# Patient Record
Sex: Female | Born: 1980 | Race: Black or African American | Hispanic: No | Marital: Single | Smoking: Never smoker
Health system: Southern US, Community
[De-identification: ages and names within clinical notes are randomized; demographics above are authoritative.]

## PROBLEM LIST (undated history)

## (undated) DIAGNOSIS — I10 Essential (primary) hypertension: Secondary | ICD-10-CM

---

## 2012-04-13 DIAGNOSIS — B370 Candidal stomatitis: Secondary | ICD-10-CM | POA: Insufficient documentation

## 2016-08-12 ENCOUNTER — Other Ambulatory Visit: Payer: Self-pay | Admitting: Family Medicine

## 2016-08-12 DIAGNOSIS — R102 Pelvic and perineal pain: Secondary | ICD-10-CM

## 2016-08-12 DIAGNOSIS — R1030 Lower abdominal pain, unspecified: Secondary | ICD-10-CM

## 2016-08-16 ENCOUNTER — Ambulatory Visit
Admission: RE | Admit: 2016-08-16 | Discharge: 2016-08-16 | Disposition: A | Discharge status: Home / Self Care | Payer: Medicaid Other | Source: Ambulatory Visit | Attending: Family Medicine | Admitting: Family Medicine

## 2016-08-16 DIAGNOSIS — R1030 Lower abdominal pain, unspecified: Secondary | ICD-10-CM | POA: Diagnosis present

## 2017-05-09 IMAGING — US US TRANSVAGINAL NON-OB
1 series · 14 of 25 positions shown · non-contrast
Comparison: None

CLINICAL DATA: Pelvic pain x1 month

EXAM:
TRANSABDOMINAL AND TRANSVAGINAL ULTRASOUND OF PELVIS
TECHNIQUE: Both transabdominal and transvaginal ultrasound examinations of the
pelvis were performed. Transabdominal technique was performed for
global imaging of the pelvis including uterus, ovaries, adnexal
regions, and pelvic cul-de-sac. It was necessary to proceed with
endovaginal exam following the transabdominal exam to visualize the
bilateral ovaries.

[Series 1: us transvaginal non-ob · 0.17mm/px · 14 of 109 slices shown]
[im 1/109]
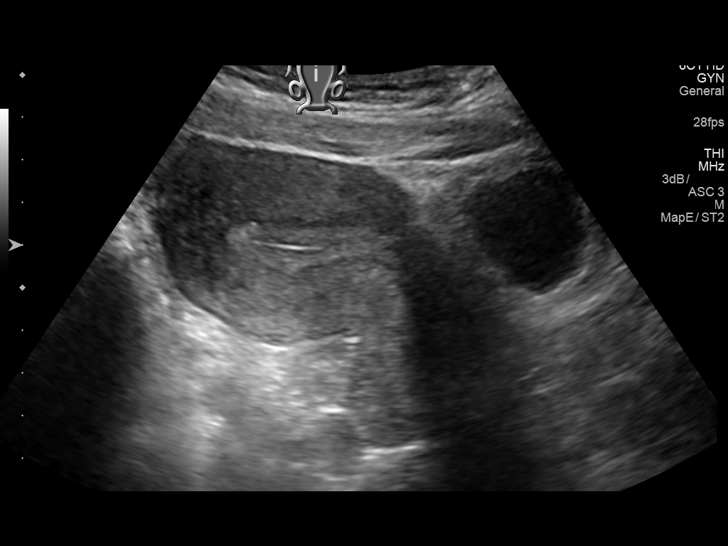
[im 10/109]
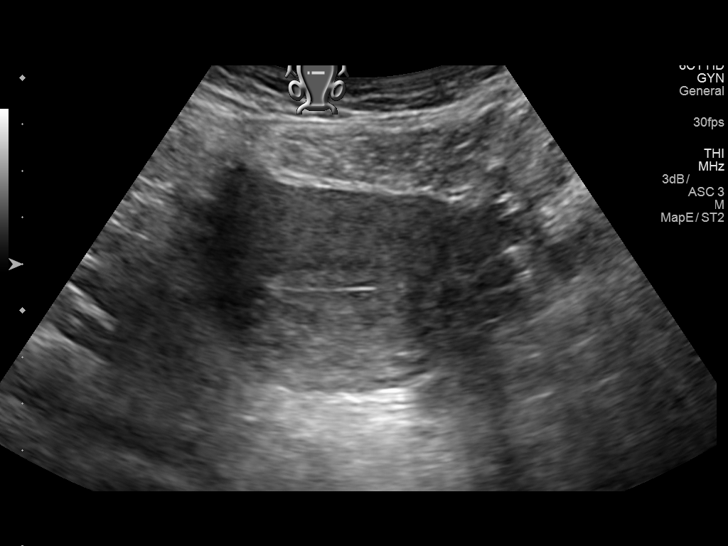
[im 19/109]
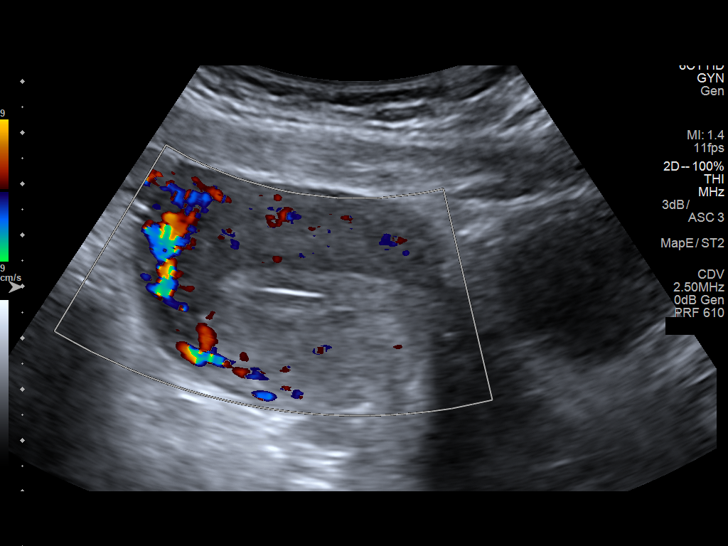
[im 28/109]
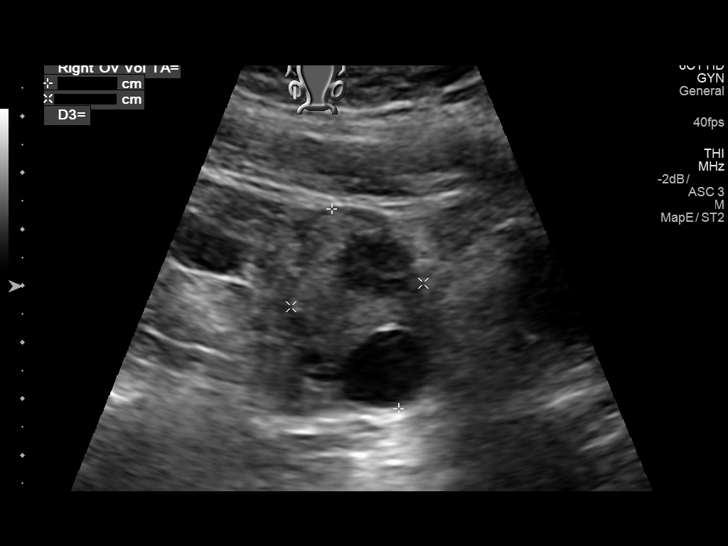
[im 37/109]
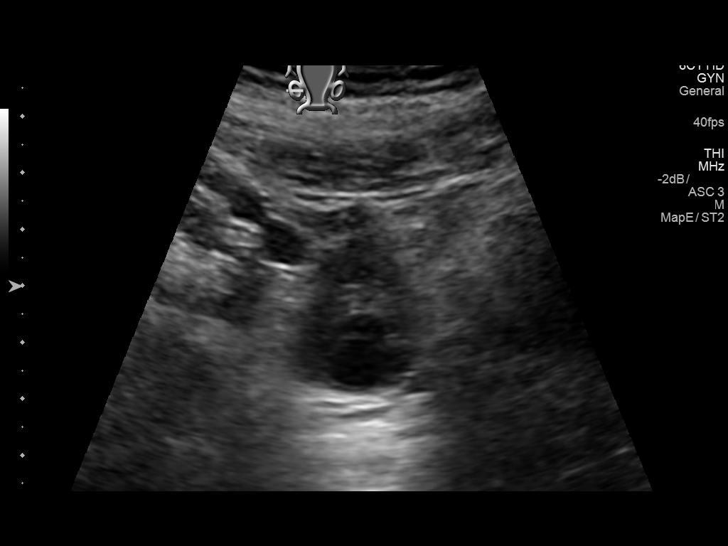
[im 41/109]
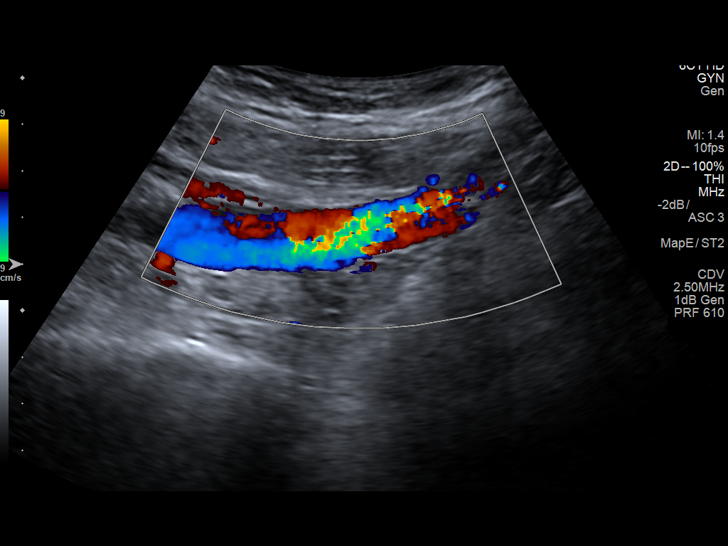
[im 50/109]
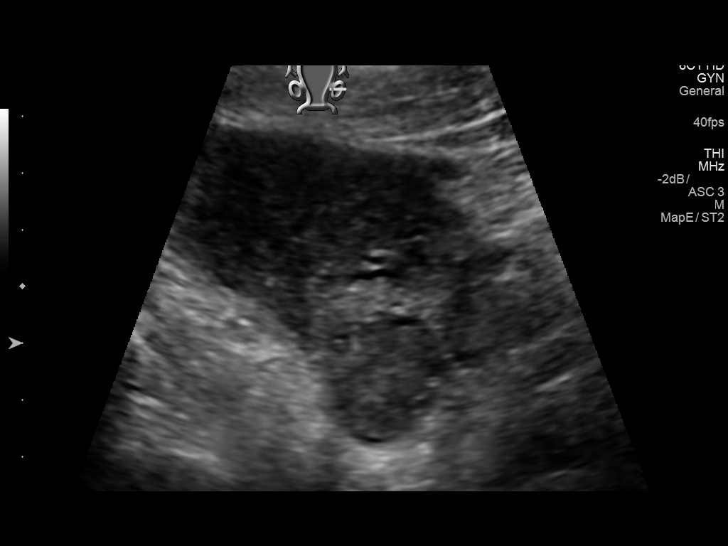
[im 59/109]
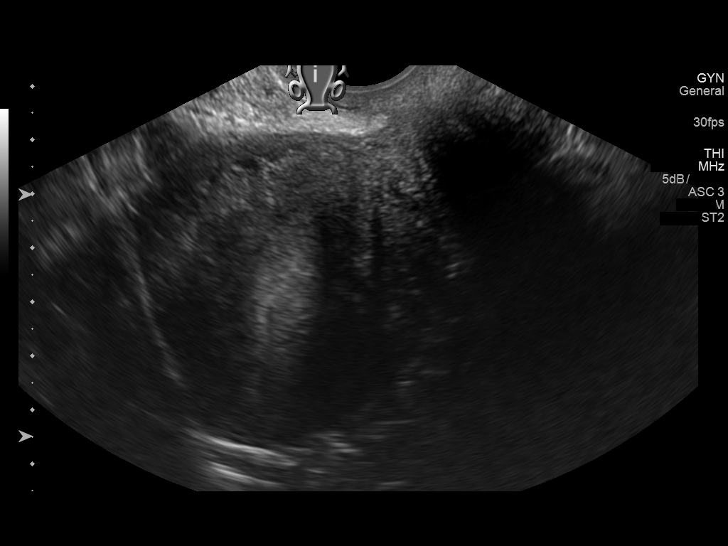
[im 68/109]
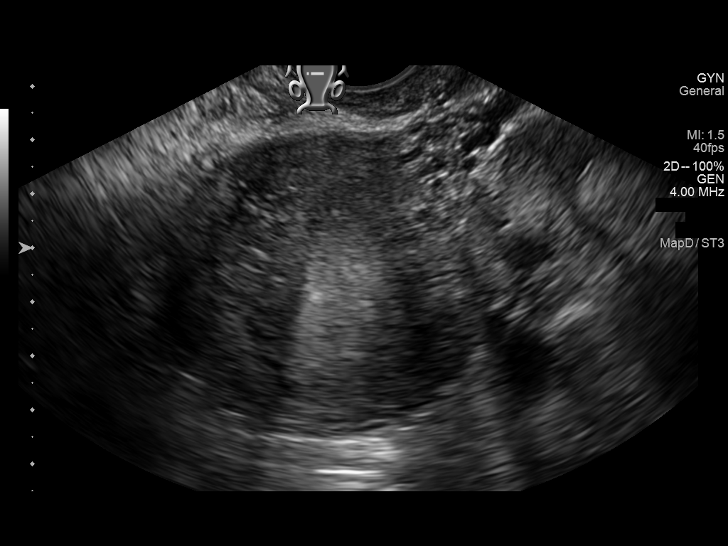
[im 73/109]
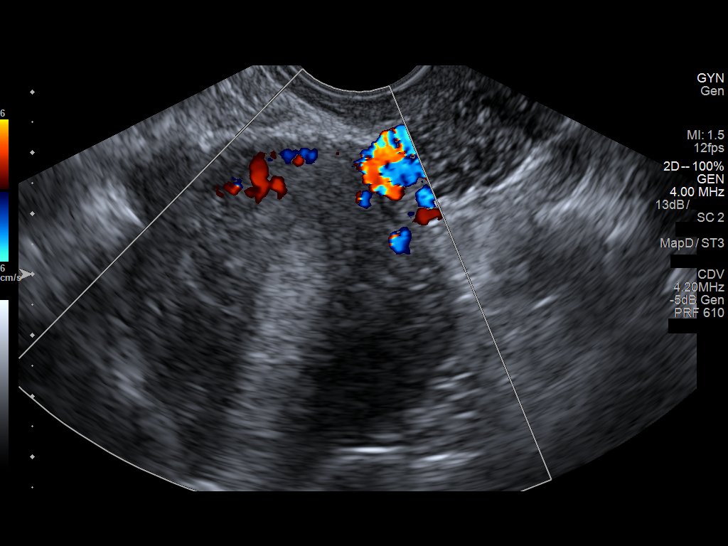
[im 82/109]
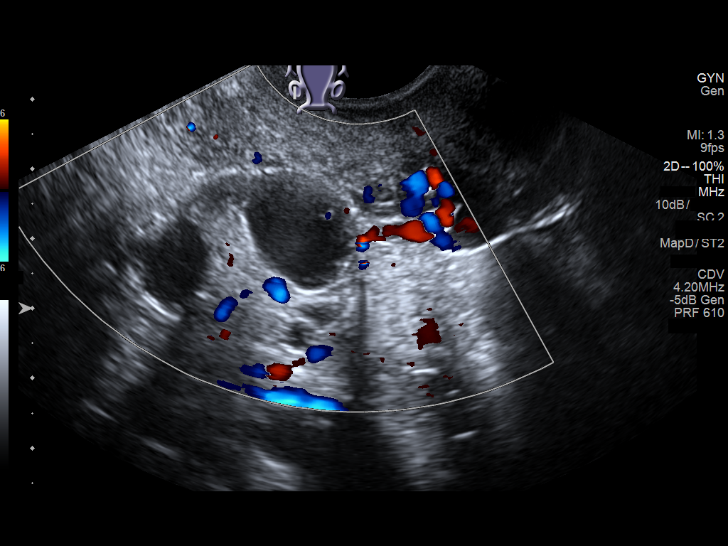
[im 91/109]
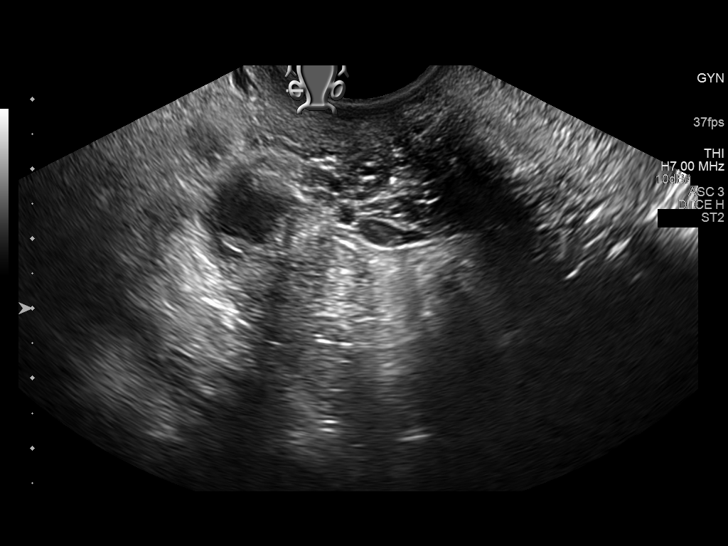
[im 100/109]
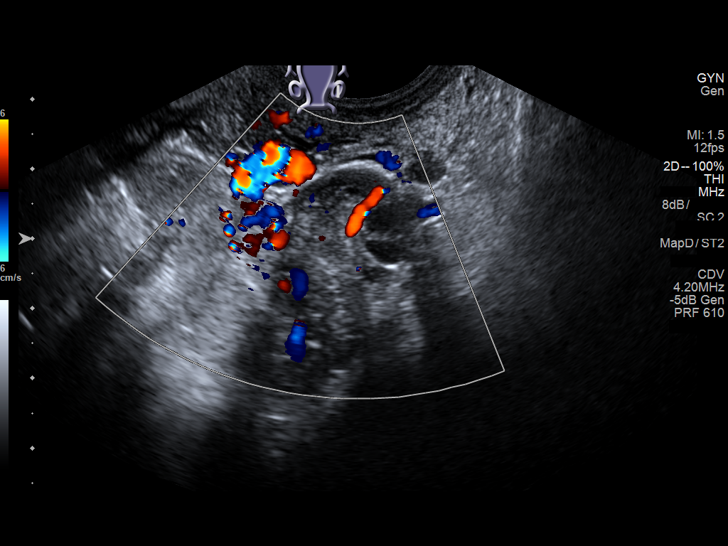
[im 109/109]
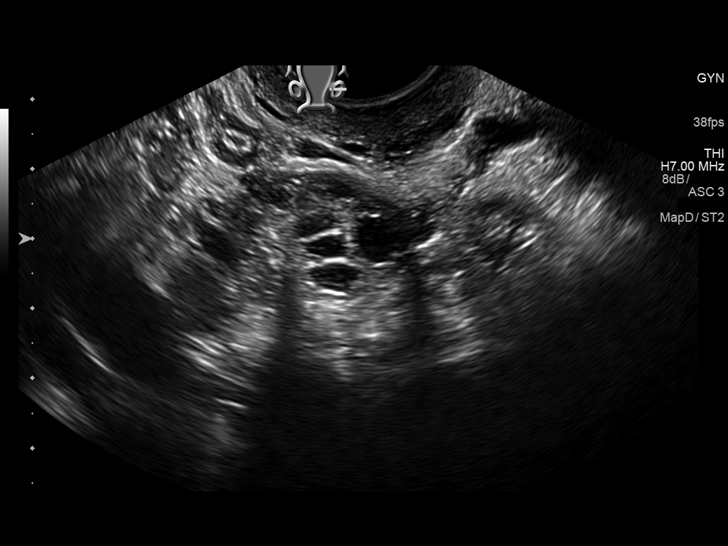

[14 of 25 positions shown; findings below may reference images not displayed]

FINDINGS: Uterus

Measurements: 9.9 x 4.6 x 5.6 cm. Dystrophic calcifications in the
lower uterine segment, likely related to prior C-section.

Endometrium

Thickness: 13 mm.  No focal abnormality visualized.

Right ovary

Measurements: 3.7 x 2.5 x 2.6 cm. Normal appearance/no adnexal mass.

Left ovary

Measurements: 3.4 x 2.0 x 3.0 cm. Normal appearance/no adnexal mass.

Other findings

No abnormal free fluid.
IMPRESSION: Negative pelvic ultrasound.

## 2017-10-06 ENCOUNTER — Other Ambulatory Visit: Payer: Self-pay | Admitting: Primary Care

## 2017-10-06 DIAGNOSIS — G8929 Other chronic pain: Secondary | ICD-10-CM

## 2017-10-06 DIAGNOSIS — R102 Pelvic and perineal pain: Principal | ICD-10-CM

## 2017-10-12 ENCOUNTER — Ambulatory Visit: Payer: Medicaid Other

## 2017-10-17 ENCOUNTER — Ambulatory Visit: Payer: Medicaid Other

## 2017-10-26 ENCOUNTER — Ambulatory Visit: Payer: Medicaid Other

## 2017-11-03 ENCOUNTER — Ambulatory Visit
Admission: RE | Admit: 2017-11-03 | Discharge: 2017-11-03 | Disposition: A | Discharge status: Home / Self Care | Payer: Medicaid Other | Source: Ambulatory Visit | Attending: Primary Care | Admitting: Primary Care

## 2017-11-03 DIAGNOSIS — G8929 Other chronic pain: Secondary | ICD-10-CM

## 2017-11-03 DIAGNOSIS — R102 Pelvic and perineal pain: Secondary | ICD-10-CM | POA: Diagnosis present

## 2018-07-27 IMAGING — US US PELVIS COMPLETE TRANSABD/TRANSVAG
1 series · 14 of 25 positions shown · non-contrast
Comparison: 08/16/2016

CLINICAL DATA: Chronic pelvic pain

EXAM:
TRANSABDOMINAL AND TRANSVAGINAL ULTRASOUND OF PELVIS
TECHNIQUE: Both transabdominal and transvaginal ultrasound examinations of the
pelvis were performed. Transabdominal technique was performed for
global imaging of the pelvis including uterus, ovaries, adnexal
regions, and pelvic cul-de-sac. It was necessary to proceed with
endovaginal exam following the transabdominal exam to visualize the
endometrium and ovaries.

[Series 1: us pelvis complete transabd/transvag · 0.20mm/px · 14 of 141 slices shown]
[im 1/141]
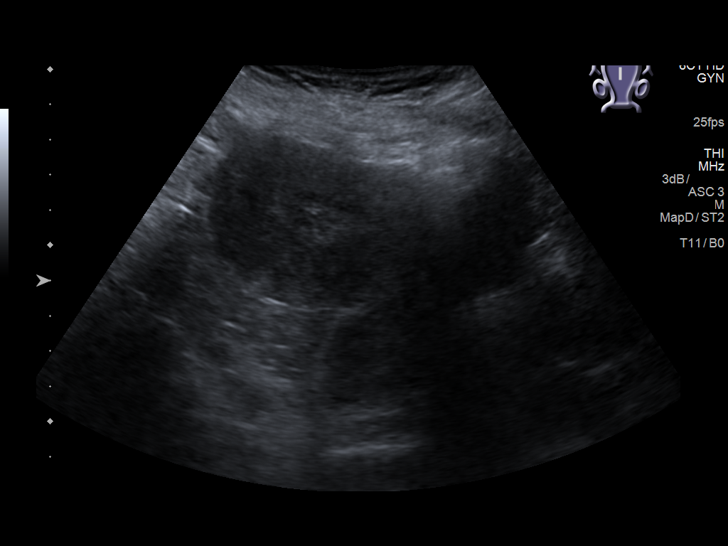
[im 12/141]
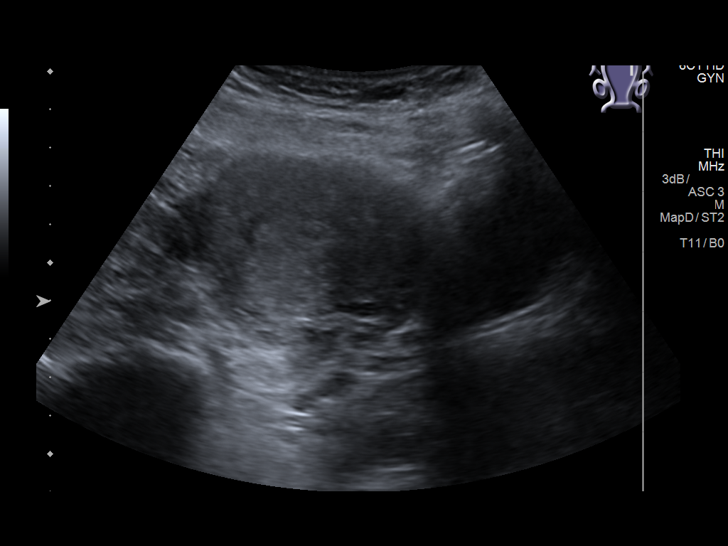
[im 24/141]
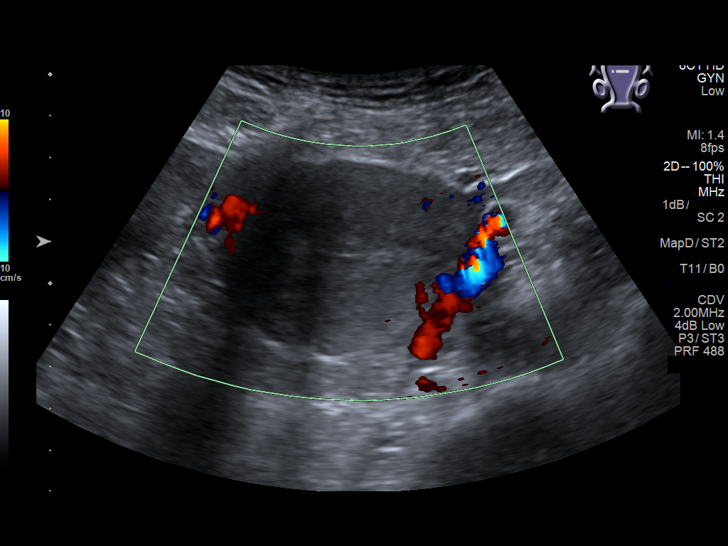
[im 36/141]
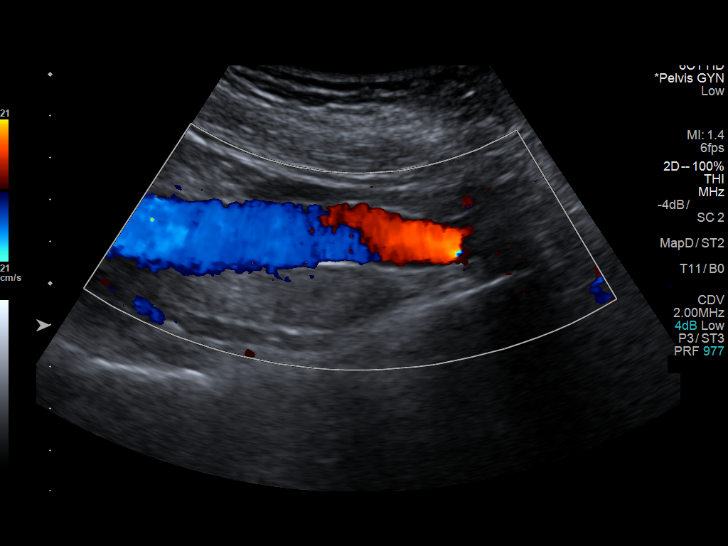
[im 47/141]
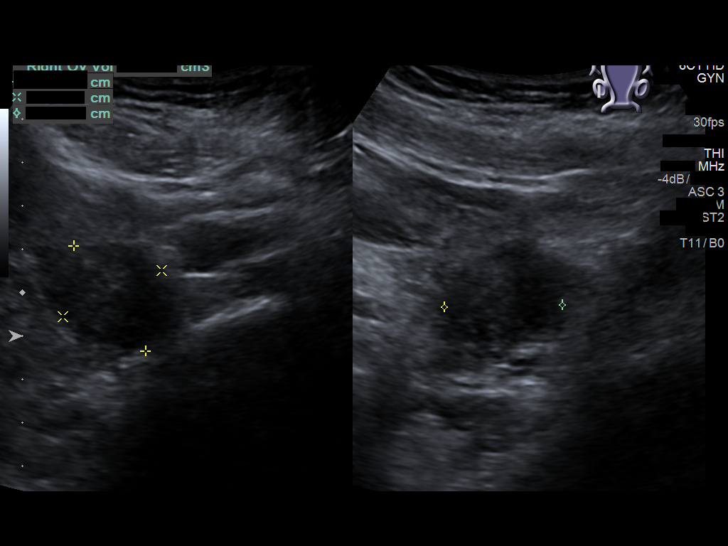
[im 53/141]
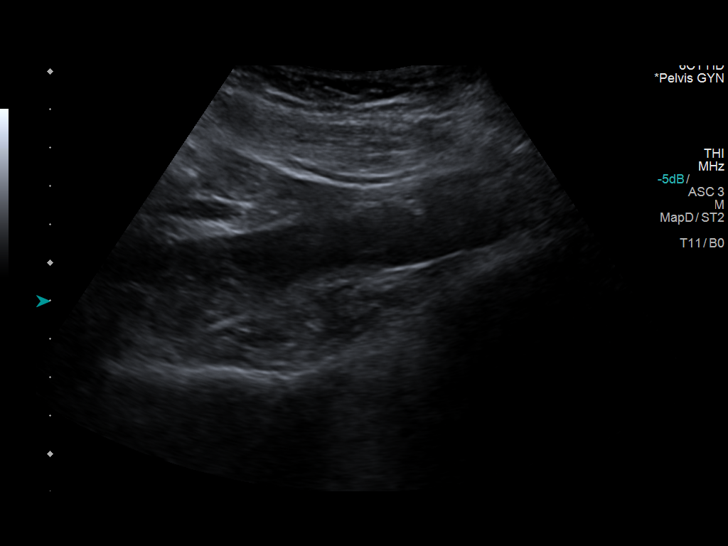
[im 65/141]
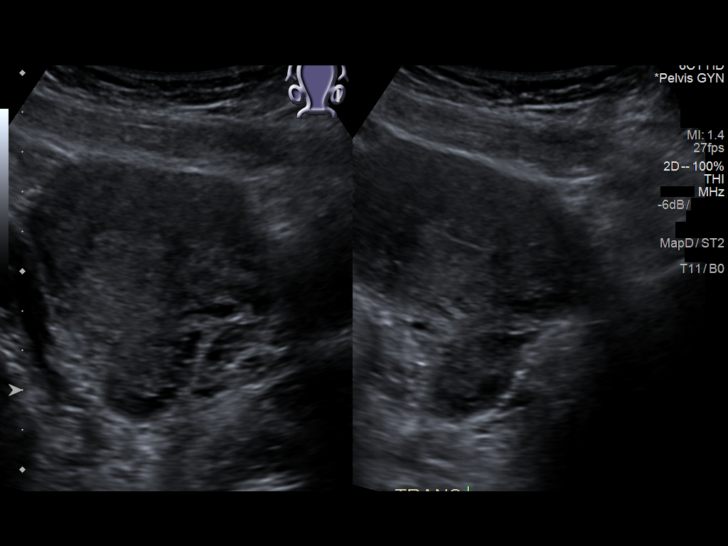
[im 76/141]
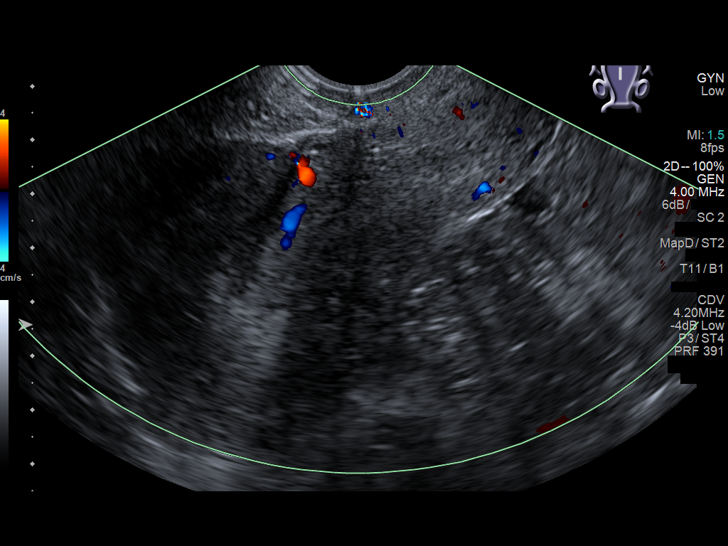
[im 88/141]
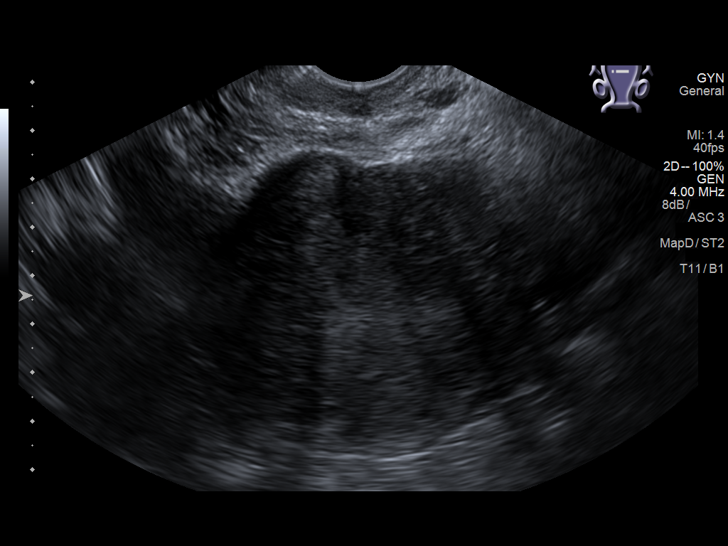
[im 94/141]
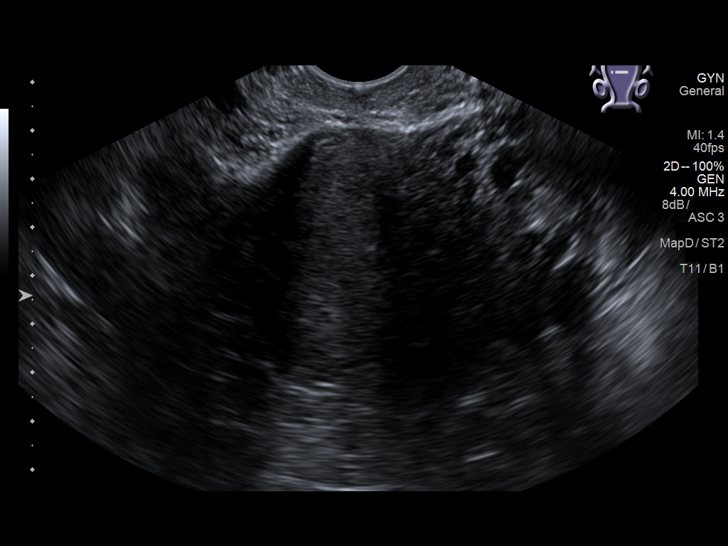
[im 106/141]
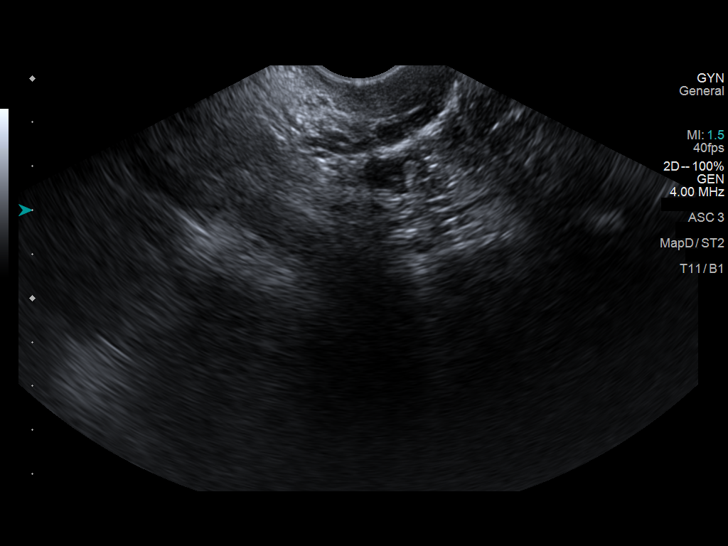
[im 117/141]
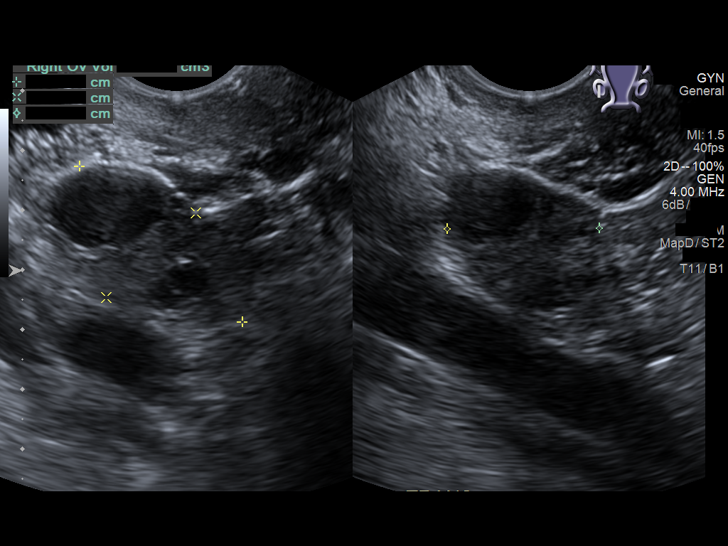
[im 129/141]
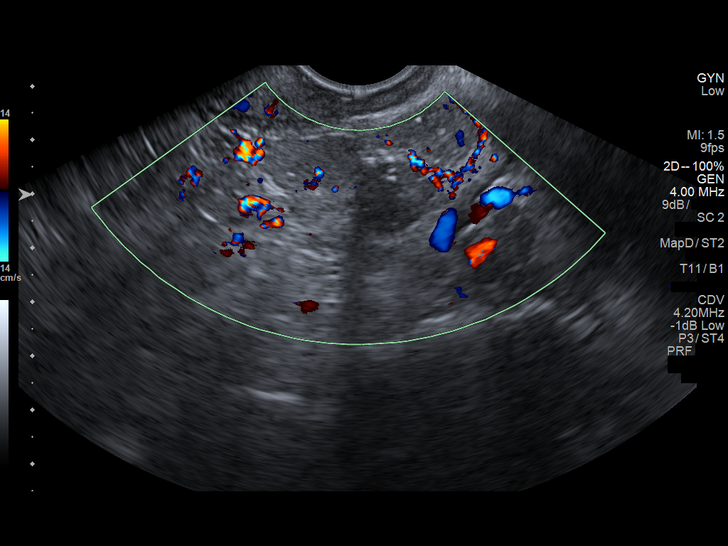
[im 141/141]
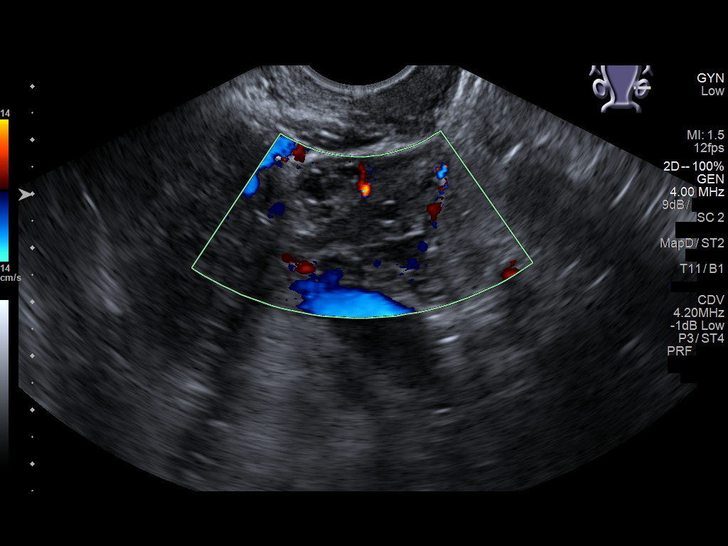

[14 of 25 positions shown; findings below may reference images not displayed]

FINDINGS: Uterus

Measurements: 9.6 x 4.9 x 7.1 cm. No fibroids or other mass
visualized.

Endometrium

Thickness: 13 mm.  No focal abnormality visualized.

Right ovary

Measurements: 2.9 x 2.5 x 2.7 cm. Normal appearance/no adnexal mass.

Left ovary

Measurements: 2.5 x 2.1 x 1.9 cm. Normal appearance/no adnexal mass.

Other findings

No abnormal free fluid.
IMPRESSION: Negative pelvic ultrasound

## 2020-05-23 ENCOUNTER — Encounter: Payer: Self-pay | Admitting: Emergency Medicine

## 2020-05-23 ENCOUNTER — Emergency Department: Payer: Medicaid Other

## 2020-05-23 ENCOUNTER — Emergency Department
Admission: EM | Admit: 2020-05-23 | Discharge: 2020-05-23 | Disposition: A | Discharge status: Home / Self Care | Payer: Medicaid Other | Attending: Emergency Medicine | Admitting: Emergency Medicine

## 2020-05-23 ENCOUNTER — Other Ambulatory Visit: Payer: Self-pay

## 2020-05-23 DIAGNOSIS — Y999 Unspecified external cause status: Secondary | ICD-10-CM | POA: Insufficient documentation

## 2020-05-23 DIAGNOSIS — Y92002 Bathroom of unspecified non-institutional (private) residence single-family (private) house as the place of occurrence of the external cause: Secondary | ICD-10-CM | POA: Insufficient documentation

## 2020-05-23 DIAGNOSIS — Y9389 Activity, other specified: Secondary | ICD-10-CM | POA: Diagnosis not present

## 2020-05-23 DIAGNOSIS — X509XXA Other and unspecified overexertion or strenuous movements or postures, initial encounter: Secondary | ICD-10-CM | POA: Diagnosis not present

## 2020-05-23 DIAGNOSIS — S39012A Strain of muscle, fascia and tendon of lower back, initial encounter: Secondary | ICD-10-CM | POA: Diagnosis not present

## 2020-05-23 DIAGNOSIS — S3992XA Unspecified injury of lower back, initial encounter: Secondary | ICD-10-CM | POA: Diagnosis present

## 2020-05-23 LAB — POCT PREGNANCY, URINE: Preg Test, Ur: NEGATIVE

## 2020-05-23 MED ORDER — CYCLOBENZAPRINE HCL 5 MG PO TABS
5.0000 mg | ORAL_TABLET | Freq: Three times a day (TID) | ORAL | 0 refills | Status: AC | PRN
Start: 2020-05-23 — End: ?

## 2020-05-23 MED ORDER — KETOROLAC TROMETHAMINE 10 MG PO TABS: 10.0000 mg | ORAL_TABLET | Freq: Three times a day (TID) | ORAL | 0 refills | Status: DC

## 2020-05-23 MED ORDER — ORPHENADRINE CITRATE 30 MG/ML IJ SOLN
60.0000 mg | INTRAMUSCULAR | Status: AC
  Administered 2020-05-23: 60 mg via INTRAMUSCULAR
  Filled 2020-05-23: qty 2

## 2020-05-23 MED ORDER — KETOROLAC TROMETHAMINE 30 MG/ML IJ SOLN
30.0000 mg | Freq: Once | INTRAMUSCULAR | Status: AC
  Administered 2020-05-23: 30 mg via INTRAMUSCULAR
  Filled 2020-05-23: qty 1

## 2020-05-23 MED ORDER — LIDOCAINE 5 % EX PTCH
1.0000 | MEDICATED_PATCH | CUTANEOUS | 0 refills | Status: AC
Start: 2020-05-23 — End: 2020-05-28

## 2020-05-23 NOTE — ED Provider Notes (Signed)
[facility] Emergency Department Provider Note ____________________________________________  Time seen: 1508  I have reviewed the triage vital signs and the nursing notes.  HISTORY  Chief Complaint  Back Pain and Leg Pain  HPI Brittany Holland is a 39 y.o. female presents to the ED for evaluation of right lower back pain with referral into the RLE. She denies fall or trauma, but reports onset after she reached up while in the shower yesterday. She describes sharp LBP without incontinence, saddle anesthesias, or foot drop.     History reviewed. No pertinent past medical history.  There are no problems to display for this patient.   Past Surgical History:  Procedure Laterality Date  . CESAREAN SECTION     x 2    Prior to Admission medications   Medication Sig Start Date End Date Taking? Authorizing Provider  cyclobenzaprine (FLEXERIL) 5 MG tablet Take 1 tablet (5 mg total) by mouth 3 (three) times daily as needed. 05/23/20   Ayani Ospina, Dannielle Karvonen, PA-C  ketorolac (TORADOL) 10 MG tablet Take 1 tablet (10 mg total) by mouth every 8 (eight) hours. 05/23/20   Sierrah Luevano, Dannielle Karvonen, PA-C  lidocaine (LIDODERM) 5 % Place 1 patch onto the skin daily for 5 doses. Remove & Discard patch after 12 hours of wear each day. 05/23/20 05/28/20  Alianis Trimmer, Dannielle Karvonen, PA-C    Allergies Patient has no known allergies.  History reviewed. No pertinent family history.  Social History Social History   Tobacco Use  . Smoking status: Never Smoker  . Smokeless tobacco: Never Used  Substance Use Topics  . Alcohol use: Not Currently  . Drug use: Not Currently    Review of Systems  Constitutional: Negative for fever. Cardiovascular: Negative for chest pain. Respiratory: Negative for shortness of breath. Gastrointestinal: Negative for abdominal pain, vomiting and diarrhea. Genitourinary: Negative for dysuria. Musculoskeletal: Positive for back pain. Skin: Negative for  rash. Neurological: Negative for headaches, focal weakness or numbness. ____________________________________________  PHYSICAL EXAM:  VITAL SIGNS: ED Triage Vitals  Enc Vitals Group     BP 05/23/20 1349 (!) 176/102     Pulse Rate 05/23/20 1349 86     Resp 05/23/20 1349 19     Temp 05/23/20 1349 98.7 F (37.1 C)     Temp Source 05/23/20 1349 Oral     SpO2 05/23/20 1349 100 %     Weight 05/23/20 1349 230 lb (104.3 kg)     Height 05/23/20 1349 5\' 7"  (1.702 m)     Head Circumference --      Peak Flow --      Pain Score 05/23/20 1401 7     Pain Loc --      Pain Edu? --      Excl. in [place]? --     Constitutional: Alert and oriented. Well appearing and in no distress. Head: Normocephalic and atraumatic. Eyes: Conjunctivae are normal. Normal extraocular movements Neck: Supple. No thyromegaly. Cardiovascular: Normal rate, regular rhythm. Normal distal pulses. Respiratory: Normal respiratory effort. No wheezes/rales/rhonchi. Gastrointestinal: Soft and nontender. No distention. Musculoskeletal: Spinal alignment without midline tenderness, spasm, deformity, or step-off.  Patient has instructed exam without assistance.  nontender with normal range of motion in all extremities.  Neurologic: Cranial nerves II through XII grossly intact.  Normal LE DTRs bilaterally.  Normal gait without ataxia. Normal speech and language. No gross focal neurologic deficits are appreciated. Skin:  Skin is warm, dry and intact. No rash noted. Psychiatric: Mood and  affect are normal. Patient exhibits appropriate insight and judgment. ____________________________________________   LABS (pertinent positives/negatives) Labs Reviewed  POCT PREGNANCY, URINE  ____________________________________________   RADIOLOGY  DG Lumbar Spine IMPRESSION: Normal lumbar spine. ____________________________________________  PROCEDURES  Toradol 30 mg IM Norflex 60 mg  IM  Procedures ____________________________________________  INITIAL IMPRESSION / ASSESSMENT AND PLAN / ED COURSE  Patient with ED evaluation of acute lumbosacral muscle strain after reaching in the shower.  No fall or trauma reported.  Exam is overall benign reassuring at this time.  No acute or muscle deficit is appreciated x-ray is negative and reassuring as shows no acute spinal process changes.  Patient reports improvement of her symptoms after ED medication administration.  She be discharged with prescription for Toradol, Flexeril, and Lidoderm patches.  Follow-up with primary provider return to the ED as needed.  Brittany Holland was evaluated in Emergency Department on 05/23/2020 for the symptoms described in the history of present illness. She was evaluated in the context of the global COVID-19 pandemic, which necessitated consideration that the patient might be at risk for infection with the SARS-CoV-2 virus that causes COVID-19. Institutional protocols and algorithms that pertain to the evaluation of patients at risk for COVID-19 are in a state of rapid change based on information released by regulatory bodies including the CDC and federal and state organizations. These policies and algorithms were followed during the patient's care in the ED. ____________________________________________  FINAL CLINICAL IMPRESSION(S) / ED DIAGNOSES  Final diagnoses:  Strain of lumbar region, initial encounter      Lissa Hoard, PA-C 05/23/20 2247    Sharyn Creamer, MD 05/25/20 0006

## 2020-05-23 NOTE — ED Notes (Signed)
See triage note  Presents with lower back pain  States pain is moving into right leg   States pain started yesterday  Became worse this am

## 2020-05-23 NOTE — ED Triage Notes (Signed)
Pt to ED via POV c/o back pain and right leg pain. Pt states that the pain started yesterday. Pt denies recent injury. Pt states that she reached up yesterday while in the shower and got a sharp pain in her back, the pain got worse as the day went on. Pt states that when she woke up this morning that pain was in her back and leg. Pt states that she has taken Aleve last night and it did not help with the pain. Pt is in NAD.

## 2020-05-23 NOTE — Discharge Instructions (Signed)
Your exam and XR are negative for any acute finding. You are experiencing muscle pain and spasms from your lumbosacral strain. Take the prescription meds as directed. Apply ice and/or moist heat to any sore muscles. Follow-up with your provider for continued symptoms. Return to the ED as needed.

## 2020-07-16 ENCOUNTER — Ambulatory Visit: Payer: Medicaid Other

## 2020-07-22 ENCOUNTER — Ambulatory Visit: Payer: Medicaid Other

## 2020-07-24 ENCOUNTER — Ambulatory Visit: Payer: Medicaid Other | Attending: Family Medicine

## 2020-07-24 ENCOUNTER — Other Ambulatory Visit: Payer: Self-pay

## 2020-07-24 DIAGNOSIS — M545 Low back pain, unspecified: Secondary | ICD-10-CM

## 2020-07-24 DIAGNOSIS — M5416 Radiculopathy, lumbar region: Secondary | ICD-10-CM | POA: Diagnosis present

## 2020-07-24 DIAGNOSIS — M6281 Muscle weakness (generalized): Secondary | ICD-10-CM | POA: Insufficient documentation

## 2020-07-24 DIAGNOSIS — R262 Difficulty in walking, not elsewhere classified: Secondary | ICD-10-CM | POA: Diagnosis present

## 2020-07-24 NOTE — Therapy (Signed)
[facility] PHYSICAL AND SPORTS MEDICINE [address] [place], [place], [id number] Phone: [phone]   Fax:  [phone]  Physical Therapy Evaluation  Patient Details  Name: Brittany RusselMaronda Flight MRN: [id number] Date of Birth: November 19, 1981 Referring Provider (PT): Toy CookeyEmily Headrick, FNP   Encounter Date: 07/24/2020   PT End of Session - 07/24/20 0854    Visit Number 1    Number of Visits 4    Date for PT Re-Evaluation 08/21/20    Authorization Type 1    Authorization Time Period of 4 Medicaid    PT Start Time [phone]    PT Stop Time 0946    PT Time Calculation (min) 52 min    Activity Tolerance Patient tolerated treatment well    [facility] for tasks assessed/performed           No past medical history on file.  Past Surgical History:  Procedure Laterality Date   CESAREAN SECTION     x 2    There were no vitals filed for this visit.    Subjective Assessment - 07/24/20 0858    Subjective R low back: 0/10 currently (but has pain in R LE), 7/10 at worst for the past month. R LE pain: 6/10 currently (pt sitting on chair), 7/10 at worst for the past month with pain medication cyclobenzaprine and ibuprofen (8/10 without pain medication).    Pertinent History Low back pain with radiating symptoms. Pain along R low back, posterior hip radiating to proximal medial leg (along the L 4 dermatome). Sudden onset in June 2021. Pt just started having sharp pain in her back when reaching up with her R UE in the shower. Does a lot of walking at work. Works 8 hours. Does not really do heavy lifting. 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 Has not had back pain before. Pt was told to have sciatic nerve pain on one side of the body. Denies loss of bowel or bladder control. Denies saddle anesthesia.    Patient Stated Goals Be able to get into and out of the care more comfortably.    Currently  in Pain? Yes    Pain Score 6     Pain Orientation Right    Pain Type Acute pain    Pain Radiating Towards R LE along the L 4 dermatome    Pain Onset More than a month ago    Pain Frequency Occasional    Aggravating Factors  Getting ino and out of a car, R hip flexion, squatting, bending over at times, prone position    Pain Relieving Factors cyclobenzaprine and ibuprofen; S/L (L side most comfortable), sitting              [facility] PT Assessment - 07/24/20 0858      Assessment   Medical Diagnosis Back pain with radiation    Referring Provider (PT) Toy CookeyEmily Headrick, FNP    Onset Date/Surgical Date 06/17/20    Prior Therapy No known PT for current condition      Precautions   Precaution Comments No known precautions      Restrictions   Other Position/Activity Restrictions No known restrictions      Balance Screen   Has the patient fallen in the past 6 months No    Has the patient had a decrease in activity level because of a fear of falling?  No    Is  the patient reluctant to leave their home because of a fear of falling?  No      Observation/Other Assessments   Focus on Therapeutic Outcomes (FOTO)  Lumbar FOTO 56      Posture/Postural Control   Posture Comments protracted neck, increased lumbar lordosis,  R cervical side bend, R shoulder slightly lower, L lateral shift, movement crease along L4/5 area. R foot inversion      AROM   Lumbar Flexion WFL with R LE symptoms. Aberrant movement, hinging at L4/5 area during the return motion   R trunk rotation   Lumbar Extension WFL with low back and R LE pain    Lumbar - Right Side Bend WFL with R LE symptoms    Lumbar - Left Side Bend WFL     Lumbar - Right Rotation WFL    Lumbar - Left Rotation [facility]      Strength   Right Hip Flexion 4-/5    Right Hip Extension 4-/5   seated manually resisted   Right Hip ABduction 3-/5    Left Hip Flexion 4/5    Left Hip Extension 4-/5   seated manually resisted   Left Hip ABduction 4-/5     Right Knee Flexion 3+/5    Right Knee Extension 4+/5   R anterior thigh symptoms   Left Knee Flexion 4/5    Left Knee Extension 5/5   with R L5 radiating symptoms     Special Tests   Other special tests Long sit test suggests anterior nutation of R innominate      Ambulation/Gait   Gait Comments increased L lateral lean during L LE stance phase, decreased stance L LE                      Objective measurements completed on examination: See above findings.   No latex band allergies  Medbridge Access Code [email or web address]    Therapeutic exercise  Standing L lateral shift correction 10x5 seconds  seated R hip extension isometrics 10x5 seconds for 2 sets  Seated transversus abdominis contraction 10x5 seconds for 2 sets   Improved exercise technique, movement at target joints, use of target muscles after mod verbal, visual, tactile cues.   Response to treatment Pt tolerated session well without aggravation of symptoms  Clinical impression  Patient is a 39 year old female who came to physical therapy secondary to low back pain with R LE radiating symptoms. She also presents with altered gait pattern and posture, decreased trunk strength and control, bilateral hip weakness, positive special test suggesting lumbopelvic involvement and difficulty performing tasks which involve bending over. Pt will benefit from skilled physical therapy services to address the aforementioned deficits.            PT Education - 07/24/20 1242    Education Details ther-ex, HEP, plan of care    Person(s) Educated Patient    Methods Explanation;Demonstration;Tactile cues;Verbal cues;Handout    Comprehension Returned demonstration;Verbalized understanding            PT Short Term Goals - 07/24/20 1249      PT SHORT TERM GOAL #1   Title Patient will be able to perform her HEP consistently to decrease pain, and improve function.    Baseline Pt has started her HEP. (07/24/2020)    Time 3      Period Weeks    Status New    Target Date 08/14/20  PT Long Term Goals - 07/24/20 1402      PT LONG TERM GOAL #1   Title Patient will have a decrease in low back pain to 3/10 or less at worst to promote ability to get into and out of her car as well as perform tasks which involve bending over more comfortably.    Baseline 7/10 low back pain at most for the past month (07/24/2020)    Time 4    Period Weeks    Status New    Target Date 08/21/20      PT LONG TERM GOAL #2   Title Patient will have a decrease in  RLE pain to 3/10 or less at worst to promote ability to get into and out of her car as well as perform tasks which involve bending over more comfortably.    Baseline 8/10 R LE pain at most for the past month without pain medication (07/24/2020)    Time 4    Period Weeks    Status New    Target Date 08/21/20      PT LONG TERM GOAL #3   Title Patient will improve bilateral hip extension and abduction strength by at least 1/2 MMT grade to promote ability to perform standing tasks more comfortably for her back and R LE.    Baseline Hip extension 4-/5 bilaterally, hip abduction 3-/5 R, 4-/5 L (07/24/2020)    Time 4    Period Weeks    Status New    Target Date 08/21/20      PT LONG TERM GOAL #4   Title Patient will improve her lumbar FOTO by at least 10 points as a demonstration of improved function.    Baseline Lumbar FOTO 56 (07/24/2020)    Time 4    Period Weeks    Status New    Target Date 08/21/20                  Plan - 07/24/20 1243    Clinical Impression Statement Patient is a 39 year old female who came to physical therapy secondary to low back pain with R LE radiating symptoms. She also presents with altered gait pattern and posture, decreased trunk strength and control, bilateral hip weakness, positive special test suggesting lumbopelvic involvement and difficulty performing tasks which involve bending over. Pt will benefit from skilled  physical therapy services to address the aforementioned deficits.    Personal Factors and Comorbidities Fitness    Examination-Activity Limitations Bed Mobility;Bathing;Hygiene/Grooming;Squat;Lift;Bend;Stand;Reach Overhead;Carry;Transfers    Stability/Clinical Decision Making Stable/Uncomplicated    Clinical Decision Making Low    Rehab Potential Fair    PT Frequency 1x / week    PT Duration 4 weeks    PT Treatment/Interventions Therapeutic activities;Therapeutic exercise;Neuromuscular re-education;Patient/family education;Manual techniques;Dry needling;Spinal Manipulations;Carusone Manipulations;Aquatic Therapy;Electrical Stimulation;Iontophoresis 4mg /ml Dexamethasone;Traction;Ultrasound;Gait training;Stair training;Functional mobility training    PT Next Visit Plan trunk, core, hip strengthening, lumbopelvic control, manual techniques, modalities PRN    PT Home Exercise Plan Medbridge Access Code [email or web address]    Consulted and Agree with Plan of Care Patient           Patient will benefit from skilled therapeutic intervention in order to improve the following deficits and impairments:  Pain, Postural dysfunction, Improper body mechanics, Difficulty walking, Decreased strength, Abnormal gait, Decreased activity tolerance  Visit Diagnosis: Acute bilateral low back pain, unspecified whether sciatica present - Plan: PT plan of care cert/re-cert  Radiculopathy, lumbar region - Plan: PT plan of care cert/re-cert  Muscle  weakness (generalized) - Plan: PT plan of care cert/re-cert  Difficulty in walking, not elsewhere classified - Plan: PT plan of care cert/re-cert     Problem List There are no problems to display for this patient.   Loralyn Freshwater PT, DPT   07/24/2020, 2:55 PM  [facility] PHYSICAL AND SPORTS MEDICINE [address], [place], [address] Phone: [phone]   Fax:  [phone]  Name: Meko Gatt MRN: [id number] Date of Birth:  03/30/1981

## 2020-07-24 NOTE — Patient Instructions (Addendum)
  Access Code: [email or web address] URL: [email or web address] Date: 07/24/2020 Prepared by: Loralyn Freshwater  Exercises Seated Transversus Abdominis Bracing - 10 x daily - 7 x weekly - 3 sets - 10 reps - 5 seconds hold

## 2020-07-31 ENCOUNTER — Other Ambulatory Visit: Payer: Self-pay

## 2020-07-31 ENCOUNTER — Ambulatory Visit: Payer: Medicaid Other

## 2020-07-31 DIAGNOSIS — M5416 Radiculopathy, lumbar region: Secondary | ICD-10-CM

## 2020-07-31 DIAGNOSIS — R262 Difficulty in walking, not elsewhere classified: Secondary | ICD-10-CM

## 2020-07-31 DIAGNOSIS — M545 Low back pain, unspecified: Secondary | ICD-10-CM

## 2020-07-31 DIAGNOSIS — M6281 Muscle weakness (generalized): Secondary | ICD-10-CM

## 2020-07-31 NOTE — Patient Instructions (Signed)
Access Code: [email or web address] URL: [email or web address] Date: 07/31/2020 Prepared by: Loralyn Freshwater  Exercises Seated Transversus Abdominis Bracing - 10 x daily - 7 x weekly - 3 sets - 10 reps - 5 seconds hold Supine Posterior Pelvic Tilt - 1 x daily - 7 x weekly - 3 sets - 10 reps - 5 seconds hold Bent Knee Fallouts - 1 x daily - 7 x weekly - 3 sets - 10 reps

## 2020-07-31 NOTE — Therapy (Signed)
[facility] [address], [place], [address] Phone: [phone]   Fax:  [phone]  Physical Therapy Treatment  Patient Details  Name: Brittany Holland MRN: [id number] Date of Birth: June 23, 1981 Referring Provider (PT): Toy Cookey, FNP   Encounter Date: 07/31/2020   PT End of Session - 07/31/20 1649    Visit Number 2    Number of Visits 4    Date for PT Re-Evaluation 08/21/20    Authorization Type 2    Authorization Time Period of 4 Medicaid    PT Start Time 1649    PT Stop Time 1737    PT Time Calculation (min) 48 min    Activity Tolerance Patient tolerated treatment well    Behavior During Therapy [facility] for tasks assessed/performed           No past medical history on file.  Past Surgical History:  Procedure Laterality Date  . CESAREAN SECTION     x 2    There were no vitals filed for this visit.   Subjective Assessment - 07/31/20 1651    Subjective Low back has some pain but is not bad. Has been doing her exercise. 4/10 low back pain currently, 7/10 R LE pain currently.    Pertinent History Low back pain with radiating symptoms. Pain along R low back, posterior hip radiating to proximal medial leg (along the L 4 dermatome). Sudden onset in June 2021. Pt just started having sharp pain in her back when reaching up with her R UE in the shower. Does a lot of walking at work. Works 8 hours. Does not really do heavy lifting. 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 Has not had back pain before. Pt was told to have sciatic nerve pain on one side of the body. Denies loss of bowel or bladder control. Denies saddle anesthesia.    Patient Stated Goals Be able to get into and out of the care more comfortably.    Currently in Pain? Yes    Pain Score 7     Pain Onset More than a month ago                                      PT Education - 07/31/20 1722    Education Details ther-ex, HEP    Person(s) Educated Patient    Methods Explanation;Demonstration;Tactile cues;Verbal cues;Handout    Comprehension Verbalized understanding;Returned demonstration           Objective   No latex band allergies  Medbridge Access Code [email or web address]    Therapeutic exercise  Standing L lateral shift correction 10x5 seconds for 2 sets  Seated R lateral shift isometrics to counter L lateral shift posture 10x3 with 5 second holds   Seated manually resisted trunk flexion isometrics PT manual resistance 10x3 with 5 second holds   No low back pain in sitting and decreased R LE symptoms in sitting afterwards  Supine posterior pelvic tilt 10x3 with 5 seconds  Supine transversus abdominis contraction  With hip fallout 10x each LE  Pt demonstrates L pelvic rotation during L hip fallout  Decreased low back and R LE pain afterwards   Then with alternating leg extension in hooklying 5x each LE  hooklying manually resisted lower trunk rotation isometrics 10x5 seconds R and L for  2 sets  Log rolling for sit <> supine 2x. More comfortable for pt.   Improved exercise technique, movement at target joints, use of target muscles after mod verbal, visual, tactile cues.      Response to treatment Decreased R LE pain to 5/10 and low back to 2/10 after session.   Clinical impression Worked on core strengthening and control. Good muscle use felt with exercises. Decreased lower trunk control with L hip fallout observed. Decreased low back and R LE pain reported by pt after session. Pt will benefit from continued skilled physical therapy services to decrease pain, improve strength and function.         PT Short Term Goals - 07/24/20 1249      PT SHORT TERM GOAL #1   Title Patient will be able to perform her HEP consistently to decrease pain, and improve function.     Baseline Pt has started her HEP. (07/24/2020)    Time 3    Period Weeks    Status New    Target Date 08/14/20             PT Long Term Goals - 07/24/20 1402      PT LONG TERM GOAL #1   Title Patient will have a decrease in low back pain to 3/10 or less at worst to promote ability to get into and out of her car as well as perform tasks which involve bending over more comfortably.    Baseline 7/10 low back pain at most for the past month (07/24/2020)    Time 4    Period Weeks    Status New    Target Date 08/21/20      PT LONG TERM GOAL #2   Title Patient will have a decrease in  RLE pain to 3/10 or less at worst to promote ability to get into and out of her car as well as perform tasks which involve bending over more comfortably.    Baseline 8/10 R LE pain at most for the past month without pain medication (07/24/2020)    Time 4    Period Weeks    Status New    Target Date 08/21/20      PT LONG TERM GOAL #3   Title Patient will improve bilateral hip extension and abduction strength by at least 1/2 MMT grade to promote ability to perform standing tasks more comfortably for her back and R LE.    Baseline Hip extension 4-/5 bilaterally, hip abduction 3-/5 R, 4-/5 L (07/24/2020)    Time 4    Period Weeks    Status New    Target Date 08/21/20      PT LONG TERM GOAL #4   Title Patient will improve her lumbar FOTO by at least 10 points as a demonstration of improved function.    Baseline Lumbar FOTO 56 (07/24/2020)    Time 4    Period Weeks    Status New    Target Date 08/21/20                 Plan - 07/31/20 1725    Clinical Impression Statement Worked on core strengthening and control. Good muscle use felt with exercises. Decreased lower trunk control with L hip fallout observed. Decreased low back and R LE pain reported by pt after session. Pt will benefit from continued skilled physical therapy services to decrease pain, improve strength and function.    Personal Factors  and Comorbidities Fitness  Examination-Activity Limitations Bed Mobility;Bathing;Hygiene/Grooming;Squat;Lift;Bend;Stand;Reach Overhead;Carry;Transfers    Stability/Clinical Decision Making Stable/Uncomplicated    Rehab Potential Fair    PT Frequency 1x / week    PT Duration 4 weeks    PT Treatment/Interventions Therapeutic activities;Therapeutic exercise;Neuromuscular re-education;Patient/family education;Manual techniques;Dry needling;Spinal Manipulations;Balaban Manipulations;Aquatic Therapy;Electrical Stimulation;Iontophoresis 4mg /ml Dexamethasone;Traction;Ultrasound;Gait training;Stair training;Functional mobility training    PT Next Visit Plan trunk, core, hip strengthening, lumbopelvic control, manual techniques, modalities PRN    PT Home Exercise Plan Medbridge Access Code [email or web address]    Consulted and Agree with Plan of Care Patient           Patient will benefit from skilled therapeutic intervention in order to improve the following deficits and impairments:  Pain, Postural dysfunction, Improper body mechanics, Difficulty walking, Decreased strength, Abnormal gait, Decreased activity tolerance  Visit Diagnosis: Acute bilateral low back pain, unspecified whether sciatica present  Radiculopathy, lumbar region  Muscle weakness (generalized)  Difficulty in walking, not elsewhere classified     Problem List There are no problems to display for this patient.  PT, DPT   07/31/2020, 5:49 PM  [facility] PHYSICAL AND SPORTS MEDICINE [address], [place] Phone: [phone]   Fax:  [phone]  Name: Mikaelyn Falzone MRN: Henry Russel Date of Birth: Jun 20, 1981

## 2020-08-05 ENCOUNTER — Ambulatory Visit: Payer: Medicaid Other

## 2020-08-07 ENCOUNTER — Ambulatory Visit: Payer: Medicaid Other

## 2020-08-12 ENCOUNTER — Ambulatory Visit: Payer: Medicaid Other | Attending: Family Medicine

## 2020-08-12 DIAGNOSIS — M6281 Muscle weakness (generalized): Secondary | ICD-10-CM | POA: Insufficient documentation

## 2020-08-12 DIAGNOSIS — M545 Low back pain: Secondary | ICD-10-CM | POA: Insufficient documentation

## 2020-08-12 DIAGNOSIS — M5416 Radiculopathy, lumbar region: Secondary | ICD-10-CM | POA: Insufficient documentation

## 2020-08-12 DIAGNOSIS — R262 Difficulty in walking, not elsewhere classified: Secondary | ICD-10-CM | POA: Insufficient documentation

## 2020-08-14 ENCOUNTER — Telehealth: Payer: Self-pay

## 2020-08-14 NOTE — Telephone Encounter (Signed)
No show for previous appointment on 08/12/2020. Called patient who said that she was waiting for approval from her insurance. Informed pt that her insurance approved more visits based on the schedule notes. Informed pt of her next scheduled visit on 08/18/20 at 9 am and said that she will be able to make it.

## 2020-08-18 ENCOUNTER — Ambulatory Visit: Payer: Medicaid Other

## 2020-08-18 ENCOUNTER — Other Ambulatory Visit: Payer: Self-pay

## 2020-08-18 DIAGNOSIS — R262 Difficulty in walking, not elsewhere classified: Secondary | ICD-10-CM | POA: Diagnosis present

## 2020-08-18 DIAGNOSIS — M5416 Radiculopathy, lumbar region: Secondary | ICD-10-CM

## 2020-08-18 DIAGNOSIS — M6281 Muscle weakness (generalized): Secondary | ICD-10-CM

## 2020-08-18 DIAGNOSIS — M545 Low back pain, unspecified: Secondary | ICD-10-CM

## 2020-08-18 NOTE — Therapy (Signed)
[facility] [address] [place], [address] Phone: [phone]   Fax:  [phone]  Physical Therapy Treatment  Patient Details  Name: Brittany Holland MRN: [id number] Date of Birth: 1981/05/09 Referring Provider (PT): Gennette Pac, FNP   Encounter Date: 08/18/2020   PT End of Session - 08/18/20 0904    Visit Number 3    Number of Visits 8    Date for PT Re-Evaluation 09/04/20    Authorization Type 1    Authorization Time Period of 4 Medicaid until 09/04/2020    Authorization - Visit Number --    Authorization - Number of Visits --    PT Start Time 0904    PT Stop Time 0947    PT Time Calculation (min) 43 min    Activity Tolerance Patient tolerated treatment well    Behavior During Therapy [facility] for tasks assessed/performed           No past medical history on file.  Past Surgical History:  Procedure Laterality Date  . CESAREAN SECTION     x 2    There were no vitals filed for this visit.   Subjective Assessment - 08/18/20 0906    Subjective Low back has some pain. 5/10 currently, 6/10 at most for the past 7 days.    Pertinent History Low back pain with radiating symptoms. Pain along R low back, posterior hip radiating to proximal medial leg (along the L 4 dermatome). Sudden onset in June 2021. Pt just started having sharp pain in her back when reaching up with her R UE in the shower. Does a lot of walking at work. Works 8 hours. Does not really do heavy lifting. 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 Has not had back pain before. Pt was told to have sciatic nerve pain on one side of the body. Denies loss of bowel or bladder control. Denies saddle anesthesia.    Patient Stated Goals Be able to get into and out of the care more comfortably.    Currently in Pain? Yes    Pain Score 5     Pain Onset More than a month ago                                      PT Education - 08/18/20 1002    Education Details ther-ex    [employer]) Educated Patient    Methods Explanation;Demonstration;Tactile cues;Verbal cues;Handout    Comprehension Returned demonstration;Verbalized understanding            Objective   No latex band allergies  MedbridgeAccess Code [email or web address]  Most recent C-section 2007  Medicaid [employer] approval 4 visits from 08/06/2020 to 09/04/2020   Therapeutic exercise  Reviewed progress/current status with PT towards goals.   Manually resisted S/L hip abduction, prone hip extension.   Reviewed plan of care: 3 visits until 09/04/2020 secondary to insurance   Prone glute max set with transversus abdominis contraction 10x2 with 5 second holds each LE  5/10 R LE pain in prone position.    Prone glute max extension with PT assist for L LE  R 10x2  L 10x2 PT assist to prevent L posterior pelvic rotation to decreased R LE pull   hooklying crunches, hands on sides to promote core strength  10x3  PT positioning pt so decrease L lateral shift posture  Decreased R LE pull   L S/L  R hip abduction to promote glute med strength and decrease L lateral shift posture. 10x3      Response to treatment Decreased low back and R LE pain after session.   Clinical impression Pt demonstrates slight decrease in low back, R LE pain and improved bilateral glute med strength since initial evaluation. Functional status remains the same based on her lumbar FOTO score in which not being able to attend sessions secondary to waiting for insurance approval may play a factor. Continued working on improving core and glute strength to decrease low back stress with functional tasks. Decreased low back and R LE pain reported after session. Pt will benefit from continued skilled physical therapy services to improve strength, stability, and function and decrease low back pain. Challenges to  progress include time it takes for insurance approval as well as limited number of visits approved by her insurance.         PT Short Term Goals - 08/18/20 0908      PT SHORT TERM GOAL #1   Title Patient will be able to perform her HEP consistently to decrease pain, and improve function.    Baseline Pt has started her HEP. (07/24/2020); Has been doing her HEP consistently (08/18/2020)    Time 3    Period Weeks    Status Achieved    Target Date 08/14/20             PT Long Term Goals - 08/18/20 0909      PT LONG TERM GOAL #1   Title Patient will have a decrease in low back pain to 3/10 or less at worst to promote ability to get into and out of her car as well as perform tasks which involve bending over more comfortably.  R LE is about 7/10 currently.    Baseline 7/10 low back pain at most for the past month (07/24/2020); 6/10 at most for the past 7 days (08/18/2020)    Time 2    Period Weeks    Status Partially Met    Target Date 09/04/20      PT LONG TERM GOAL #2   Title Patient will have a decrease in  RLE pain to 3/10 or less at worst to promote ability to get into and out of her car as well as perform tasks which involve bending over more comfortably.    Baseline 8/10 R LE pain at most for the past month without pain medication (07/24/2020); 7/10 at most for the past 7 days (08/18/2020)    Time 2    Period Weeks    Status Partially Met    Target Date 09/04/20      PT LONG TERM GOAL #3   Title Patient will improve bilateral hip extension and abduction strength by at least 1/2 MMT grade to promote ability to perform standing tasks more comfortably for her back and R LE.    Baseline Hip extension 4-/5 bilaterally, hip abduction 3-/5 R, 4-/5 L (07/24/2020); Hip abduction 4/5 R, 4/5 L, hip extension 4-/5 R,4-/5 L (08/18/2020)    Time 2    Period Weeks    Status Partially Met    Target Date 09/04/20      PT LONG TERM GOAL #4   Title Patient will improve her lumbar FOTO by at  least 10 points as a demonstration of improved function.  Baseline Lumbar FOTO 56 (07/24/2020); 56 (08/18/2020)    Time 2    Period Weeks    Status On-going    Target Date 09/04/20                 Plan - 08/18/20 1002    Clinical Impression Statement Pt demonstrates slight decrease in low back, R LE pain and improved bilateral glute med strength since initial evaluation. Functional status remains the same based on her lumbar FOTO score in which not being able to attend sessions secondary to waiting for insurance approval may play a factor. Continued working on improving core and glute strength to decrease low back stress with functional tasks. Decreased low back and R LE pain reported after session. Pt will benefit from continued skilled physical therapy services to improve strength, stability, and function and decrease low back pain. Challenges to progress include time it takes for insurance approval as well as limited number of visits approved by her insurance.    Personal Factors and Comorbidities Fitness    Examination-Activity Limitations Bed Mobility;Bathing;Hygiene/Grooming;Squat;Lift;Bend;Stand;Reach Overhead;Carry;Transfers    Stability/Clinical Decision Making Stable/Uncomplicated    Rehab Potential Fair    PT Frequency 2x / week    PT Duration Other (comment)   2-3 weeks   PT Treatment/Interventions Therapeutic activities;Therapeutic exercise;Neuromuscular re-education;Patient/family education;Manual techniques;Dry needling;Spinal Manipulations;Kulkarni Manipulations;Aquatic Therapy;Electrical Stimulation;Iontophoresis 54m/ml Dexamethasone;Traction;Ultrasound;Gait training;Stair training;Functional mobility training    PT Next Visit Plan trunk, core, hip strengthening, lumbopelvic control, manual techniques, modalities PRN    PT Home Exercise Plan Medbridge Access Code [email or web address]    Consulted and Agree with Plan of Care Patient           Patient will benefit from skilled  therapeutic intervention in order to improve the following deficits and impairments:  Pain, Postural dysfunction, Improper body mechanics, Difficulty walking, Decreased strength, Abnormal gait, Decreased activity tolerance  Visit Diagnosis: Acute bilateral low back pain, unspecified whether sciatica present - Plan: PT plan of care cert/re-cert  Radiculopathy, lumbar region - Plan: PT plan of care cert/re-cert  Muscle weakness (generalized) - Plan: PT plan of care cert/re-cert  Difficulty in walking, not elsewhere classified - Plan: PT plan of care cert/re-cert     Problem List There are no problems to display for this patient.  MJoneen BoersPT, DPT   08/18/2020, 10:20 AM  [facility] [address] [place] [address]Phone: [phone]  Fax:  [phone] Name: Brittany Holland MRN: [id number] of Birth: 311/21/1982

## 2020-08-20 ENCOUNTER — Ambulatory Visit: Payer: Medicaid Other

## 2020-08-21 ENCOUNTER — Encounter: Payer: Self-pay | Admitting: Podiatry

## 2020-08-21 ENCOUNTER — Other Ambulatory Visit: Payer: Self-pay

## 2020-08-21 ENCOUNTER — Ambulatory Visit: Payer: Medicaid Other | Admitting: Podiatry

## 2020-08-21 DIAGNOSIS — IMO0002 Reserved for concepts with insufficient information to code with codable children: Secondary | ICD-10-CM | POA: Insufficient documentation

## 2020-08-21 DIAGNOSIS — B353 Tinea pedis: Secondary | ICD-10-CM | POA: Diagnosis not present

## 2020-08-21 DIAGNOSIS — R102 Pelvic and perineal pain: Secondary | ICD-10-CM | POA: Insufficient documentation

## 2020-08-21 MED ORDER — CLOTRIMAZOLE-BETAMETHASONE 1-0.05 % EX CREA: 1.0000 "application " | TOPICAL_CREAM | Freq: Two times a day (BID) | CUTANEOUS | 1 refills | Status: DC

## 2020-08-21 MED ORDER — TERBINAFINE HCL 250 MG PO TABS: 250.0000 mg | ORAL_TABLET | Freq: Every day | ORAL | 0 refills | Status: AC

## 2020-08-22 ENCOUNTER — Encounter: Payer: Self-pay | Admitting: Podiatry

## 2020-08-22 NOTE — Progress Notes (Signed)
  Subjective:  Patient ID: Brittany Holland, female    DOB: 1980-12-10,  MRN: [id number]  Chief Complaint  Patient presents with  . Nail Problem    Patient presents today for athletes foot left foot x 2 years  worse in summer.  She says it burns and itches and fungal creams are no help.  She also c/o thick discolored nail left hallux which is detaching from nail bed  She denies any pain or discomfort  . Tinea Pedis    39 y.o. female presents with the above complaint.  Patient presents with complaint of bilateral athlete's foot with left more severe than right side.  Patient states that this has been going for 2 years worse in summertime there is severe burning and itching associated with it.  Patient states the over-the-counter fungal cream has not been any helpful.  She would like to discuss treatment options for this.  She denies any other acute complaints.   Review of Systems: Negative except as noted in the HPI. Denies N/V/F/Ch.  No past medical history on file.  Current Outpatient Medications:  .  clotrimazole-betamethasone (LOTRISONE) cream, Apply 1 application topically 2 (two) times daily., Disp: 45 g, Rfl: 1 .  cyclobenzaprine (FLEXERIL) 5 MG tablet, Take 1 tablet (5 mg total) by mouth 3 (three) times daily as needed., Disp: 15 tablet, Rfl: 0 .  ibuprofen (ADVIL) 800 MG tablet, Take 800 mg by mouth 3 (three) times daily., Disp: , Rfl:  .  Multiple Vitamin (MULTI-VITAMIN) tablet, Take 1 tablet by mouth daily., Disp: , Rfl:  .  terbinafine (LAMISIL) 250 MG tablet, Take 1 tablet (250 mg total) by mouth daily., Disp: 30 tablet, Rfl: 0  Social History   Tobacco Use  Smoking Status Never Smoker  Smokeless Tobacco Never Used    No Known Allergies Objective:  There were no vitals filed for this visit. There is no height or weight on file to calculate BMI. Constitutional Well developed. Well nourished.  Vascular Dorsalis pedis pulses palpable bilaterally. Posterior tibial pulses  palpable bilaterally. Capillary refill normal to all digits.  No cyanosis or clubbing noted. Pedal hair growth normal.  Neurologic Normal speech. Oriented to person, place, and time. Epicritic sensation to light touch grossly present bilaterally.  Dermatologic  multiple patches of scaliness with a superficial epidermal lysis with subjective itching noted to bilateral lower extremity left severe than right.  Orthopedic: Normal Hardie ROM without pain or crepitus bilaterally. No visible deformities. No bony tenderness.   Radiographs: None Assessment:   1. Tinea pedis of both feet    Plan:  Patient was evaluated and treated and all questions answered.  Bilateral athlete's foot left greater than right -I explained patient the etiology of athlete's foot and various treatment options were extensively discussed.  Given the amount of athlete's foot that is present I believe patient will need oral medication as well as topical medication.  She will be given Lamisil for 30 days given that she does not have any medical history especially with liver I believe she will be able to tolerate the Lamisil for 30-day course.  Patient states understanding -Lotrisone cream was also dispensed and she will apply twice a day.  No follow-ups on file.

## 2020-08-25 ENCOUNTER — Ambulatory Visit: Payer: Medicaid Other

## 2020-08-25 ENCOUNTER — Other Ambulatory Visit: Payer: Self-pay

## 2020-08-25 DIAGNOSIS — R262 Difficulty in walking, not elsewhere classified: Secondary | ICD-10-CM

## 2020-08-25 DIAGNOSIS — M545 Low back pain, unspecified: Secondary | ICD-10-CM

## 2020-08-25 DIAGNOSIS — M6281 Muscle weakness (generalized): Secondary | ICD-10-CM

## 2020-08-25 DIAGNOSIS — M5416 Radiculopathy, lumbar region: Secondary | ICD-10-CM

## 2020-08-25 NOTE — Therapy (Signed)
[facility] [address] [place], [address] Phone: [phone]   Fax:  [phone]  Physical Therapy Treatment  Patient Details  Name: Brittany Holland MRN: [id number] Date of Birth: 16-Jul-1981 Referring Provider (PT): Gennette Pac, FNP   Encounter Date: 08/25/2020   PT End of Session - 08/25/20 1306    Visit Number 4    Number of Visits 8    Date for PT Re-Evaluation 09/04/20    Authorization Type 2    Authorization Time Period of 4 Medicaid until 09/04/2020    PT Start Time 1305    PT Stop Time 1352    PT Time Calculation (min) 47 min    Activity Tolerance Patient tolerated treatment well    Behavior During Therapy [facility] for tasks assessed/performed           No past medical history on file.  Past Surgical History:  Procedure Laterality Date  . CESAREAN SECTION     x 2    There were no vitals filed for this visit.   Subjective Assessment - 08/25/20 1306    Subjective Low back is doing ok today. 4/10 currently. 6/10 R L 4 dermatome pain.    Pertinent History Low back pain with radiating symptoms. Pain along R low back, posterior hip radiating to proximal medial leg (along the L 4 dermatome). Sudden onset in June 2021. Pt just started having sharp pain in her back when reaching up with her R UE in the shower. Does a lot of walking at work. Works 8 hours. Does not really do heavy lifting. 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 Has not had back pain before. Pt was told to have sciatic nerve pain on one side of the body. Denies loss of bowel or bladder control. Denies saddle anesthesia.    Patient Stated Goals Be able to get into and out of the care more comfortably.    Currently in Pain? Yes    Pain Score 6     Pain Onset More than a month ago                                     PT Education - 08/25/20 1326      Education Details ther-ex    Person(s) Educated Patient    Methods Explanation;Demonstration;Tactile cues;Verbal cues    Comprehension Returned demonstration;Verbalized understanding          Objective   No latex band allergies  MedbridgeAccess Code [email or web address]  Most recent C-section 2007  Medicaid Healthy Blue approval 4 visits from 08/06/2020 to 09/04/2020   Therapeutic exercise  Seated manually resisted trunk flexion isometrics in neutral, PT manual resistance 10x3 with 5 second holds   Seated R hip extension isometrics 10x3 with 5 second holds    Decreased low back pain to 0/10 and R LE pain to 4/10 after aforementioned exercises  Seated L lateral shift isometrics in neutral 10x3 with 5 second holds  Standing B shoulder low rows with scapular retraction red band 10x3 with 5 second holds   Prone glute max extension             R 10x. R LE pulling sensation             L 10x3  hooklying crunches, hands on sides to  promote core strength              10x3  Mini bridge 5x4   Standing gentle lumbar extension 10x5 seconds for 2 sets  Decreased R LE pain while in the extension position.     Response to treatment Decreased low back and R LE pain after session.  Clinical impression  Pt demonstrates slight extension preference today secondary to centralizing symptoms with gentle back extension. Continued working on improving core and glute strength, as well as improving posture to decrease stress to low back. No low back pain and decreased R LE pain to 4/10 after session. Pt will benefit from continued skilled physical therapy services to decrease pain, improve strength and function.       PT Short Term Goals - 08/18/20 0908      PT SHORT TERM GOAL #1   Title Patient will be able to perform her HEP consistently to decrease pain, and improve function.    Baseline Pt has started her HEP. (07/24/2020); Has been doing her HEP consistently (08/18/2020)    Time 3     Period Weeks    Status Achieved    Target Date 08/14/20             PT Long Term Goals - 08/18/20 0909      PT LONG TERM GOAL #1   Title Patient will have a decrease in low back pain to 3/10 or less at worst to promote ability to get into and out of her car as well as perform tasks which involve bending over more comfortably.  R LE is about 7/10 currently.    Baseline 7/10 low back pain at most for the past month (07/24/2020); 6/10 at most for the past 7 days (08/18/2020)    Time 2    Period Weeks    Status Partially Met    Target Date 09/04/20      PT LONG TERM GOAL #2   Title Patient will have a decrease in  RLE pain to 3/10 or less at worst to promote ability to get into and out of her car as well as perform tasks which involve bending over more comfortably.    Baseline 8/10 R LE pain at most for the past month without pain medication (07/24/2020); 7/10 at most for the past 7 days (08/18/2020)    Time 2    Period Weeks    Status Partially Met    Target Date 09/04/20      PT LONG TERM GOAL #3   Title Patient will improve bilateral hip extension and abduction strength by at least 1/2 MMT grade to promote ability to perform standing tasks more comfortably for her back and R LE.    Baseline Hip extension 4-/5 bilaterally, hip abduction 3-/5 R, 4-/5 L (07/24/2020); Hip abduction 4/5 R, 4/5 L, hip extension 4-/5 R,4-/5 L (08/18/2020)    Time 2    Period Weeks    Status Partially Met    Target Date 09/04/20      PT LONG TERM GOAL #4   Title Patient will improve her lumbar FOTO by at least 10 points as a demonstration of improved function.    Baseline Lumbar FOTO 56 (07/24/2020); 56 (08/18/2020)    Time 2    Period Weeks    Status On-going    Target Date 09/04/20                 Plan - 08/25/20 1327  Clinical Impression Statement Pt demonstrates slight extension preference today secondary to centralizing symptoms with gentle back extension. Continued working on improving  core and glute strength, as well as improving posture to decrease stress to low back. No low back pain and decreased R LE pain to 4/10 after session. Pt will benefit from continued skilled physical therapy services to decrease pain, improve strength and function.    Personal Factors and Comorbidities Fitness    Examination-Activity Limitations Bed Mobility;Bathing;Hygiene/Grooming;Squat;Lift;Bend;Stand;Reach Overhead;Carry;Transfers    Stability/Clinical Decision Making Stable/Uncomplicated    Rehab Potential Fair    PT Frequency 2x / week    PT Duration Other (comment)   2-3 weeks   PT Treatment/Interventions Therapeutic activities;Therapeutic exercise;Neuromuscular re-education;Patient/family education;Manual techniques;Dry needling;Spinal Manipulations;Normoyle Manipulations;Aquatic Therapy;Electrical Stimulation;Iontophoresis 50m/ml Dexamethasone;Traction;Ultrasound;Gait training;Stair training;Functional mobility training    PT Next Visit Plan trunk, core, hip strengthening, lumbopelvic control, manual techniques, modalities PRN    PT Home Exercise Plan Medbridge Access Code [email or web address]    Consulted and Agree with Plan of Care Patient           Patient will benefit from skilled therapeutic intervention in order to improve the following deficits and impairments:  Pain, Postural dysfunction, Improper body mechanics, Difficulty walking, Decreased strength, Abnormal gait, Decreased activity tolerance  Visit Diagnosis: Acute bilateral low back pain, unspecified whether sciatica present  Radiculopathy, lumbar region  Muscle weakness (generalized)  Difficulty in walking, not elsewhere classified     Problem List Patient Active Problem List   Diagnosis Date Noted  . Abnormal Pap smear 08/21/2020  . Female pelvic pain 08/21/2020  . Bacterial vaginosis 04/13/2012  . Oral yeast infection 04/13/2012  . GERD (gastroesophageal reflux disease) 07/15/2011    MJoneen BoersPT,  DPT   08/25/2020, 2:11 PM  [facility] [address] [place] [address]Phone: [phone]  Fax:  [phone] Name: Brittany Holland MRN: [id number] of Birth: [birth year]

## 2020-08-26 ENCOUNTER — Ambulatory Visit: Payer: Medicaid Other | Admitting: Podiatry

## 2020-08-26 ENCOUNTER — Encounter: Payer: Self-pay | Admitting: Podiatry

## 2020-08-26 ENCOUNTER — Other Ambulatory Visit: Payer: Self-pay

## 2020-08-26 DIAGNOSIS — B353 Tinea pedis: Secondary | ICD-10-CM

## 2020-08-26 DIAGNOSIS — L603 Nail dystrophy: Secondary | ICD-10-CM | POA: Diagnosis not present

## 2020-08-26 DIAGNOSIS — L600 Ingrowing nail: Secondary | ICD-10-CM | POA: Diagnosis not present

## 2020-08-26 MED ORDER — ITRACONAZOLE 100 MG PO CAPS
ORAL_CAPSULE | ORAL | 0 refills | Status: AC
Start: 2020-08-26 — End: ?

## 2020-08-26 MED ORDER — CLOTRIMAZOLE-BETAMETHASONE 1-0.05 % EX CREA: 1.0000 "application " | TOPICAL_CREAM | Freq: Two times a day (BID) | CUTANEOUS | 0 refills | Status: AC

## 2020-08-26 NOTE — Patient Instructions (Signed)
Soak Instructions    THE DAY AFTER THE PROCEDURE  Place 1/4 cup of epsom salts Betadine in a quart of warm tap water.  Submerge your foot or feet with outer bandage intact for the initial soak; this will allow the bandage to become moist and wet for easy lift off.  Once you remove your bandage, continue to soak in the solution for 20 minutes.  This soak should be done twice a day.  Next, remove your foot or feet from solution, blot dry the affected area and cover.  You may use a band aid large enough to cover the area or use gauze and tape.  Apply other medications to the area as directed by the doctor such as polysporin neosporin.  IF YOUR SKIN BECOMES IRRITATED WHILE USING THESE INSTRUCTIONS, IT IS OKAY TO SWITCH TO  WHITE VINEGAR AND WATER. Or you may use antibacterial soap and water to keep the toe clean  Monitor for any signs/symptoms of infection. Call the office immediately if any occur or go directly to the emergency room. Call with any questions/concerns.    Long Term Care Instructions-Post Nail Surgery  You have had your ingrown toenail and root treated with a chemical.  This chemical causes a burn that will drain and ooze like a blister.  This can drain for 6-8 weeks or longer.  It is important to keep this area clean, covered, and follow the soaking instructions dispensed at the time of your surgery.  This area will eventually dry and form a scab.  Once the scab forms you no longer need to soak or apply a dressing.  If at any time you experience an increase in pain, redness, swelling, or drainage, you should contact the office as soon as possible. 

## 2020-08-26 NOTE — Progress Notes (Signed)
Subjective:  Patient ID: Brittany Holland, female    DOB: 1981/10/21,  MRN: [id number]  Chief Complaint  Patient presents with  . Tinea Pedis    "the cream is helping with the itching and irritation, but the Lamisil was too strong.  I want to talk about other alternatives"    39 y.o. female presents with the above complaint.  Patient presents with follow-up of bilateral athlete's foot.  She was not able to tolerate the Lamisil.  She would like to discuss other treatment options.  The Lotrisone cream helped may help with the itching.  She would like to refill on that as well.  She has secondary complaint of left hallux onychomycosis and she would like to have the nail completely removed.  She states that is very dystrophic painful she states that she is not able to tolerate the pain there.  She denies any other acute complaints.   Review of Systems: Negative except as noted in the HPI. Denies N/V/F/Ch.  No past medical history on file.  Current Outpatient Medications:  .  clotrimazole-betamethasone (LOTRISONE) cream, Apply 1 application topically 2 (two) times daily., Disp: 45 g, Rfl: 1 .  clotrimazole-betamethasone (LOTRISONE) cream, Apply 1 application topically 2 (two) times daily., Disp: 30 g, Rfl: 0 .  cyclobenzaprine (FLEXERIL) 5 MG tablet, Take 1 tablet (5 mg total) by mouth 3 (three) times daily as needed., Disp: 15 tablet, Rfl: 0 .  ibuprofen (ADVIL) 800 MG tablet, Take 800 mg by mouth 3 (three) times daily., Disp: , Rfl:  .  itraconazole (SPORANOX) 100 MG capsule, Take 1 tablet twice daily x 7 days, then hold for 3 weeks, then repeat cycle, Disp: 14 capsule, Rfl: 0 .  Multiple Vitamin (MULTI-VITAMIN) tablet, Take 1 tablet by mouth daily., Disp: , Rfl:  .  terbinafine (LAMISIL) 250 MG tablet, Take 1 tablet (250 mg total) by mouth daily., Disp: 30 tablet, Rfl: 0  Social History   Tobacco Use  Smoking Status Never Smoker  Smokeless Tobacco Never Used    No Known  Allergies Objective:  There were no vitals filed for this visit. There is no height or weight on file to calculate BMI. Constitutional Well developed. Well nourished.  Vascular Dorsalis pedis pulses palpable bilaterally. Posterior tibial pulses palpable bilaterally. Capillary refill normal to all digits.  No cyanosis or clubbing noted. Pedal hair growth normal.  Neurologic Normal speech. Oriented to person, place, and time. Epicritic sensation to light touch grossly present bilaterally.  Dermatologic  multiple patches of scaliness with a superficial epidermal lysis with subjective itching noted to bilateral lower extremity left severe than right.  Pain on palpation of the entire/total nail on 1st digit of the left  Orthopedic: Normal Lacaze ROM without pain or crepitus bilaterally. No visible deformities. No bony tenderness.   Radiographs: None Assessment:   1. Ingrown left big toenail   2. Nail dystrophy   3. Tinea pedis of both feet    Plan:  Patient was evaluated and treated and all questions answered.  Bilateral athlete's foot left greater than right -I explained patient the etiology of athlete's foot and various treatment options were extensively discussed.  She was not able to tolerate the Lamisil well as it gave her stomachaches.  I believe patient may benefit more itraconazole medication orally.  Itraconazole was sent to the pharmacy. -Lotrisone cream was also dispensed and she will apply twice a day for refill.  Nail contusion/dystrophy hallux, left -Patient elects to proceed with minor surgery to  remove entire toenail today. Consent reviewed and signed by patient. -Entire/total nail excised. See procedure note. -Educated on post-procedure care including soaking. Written instructions provided and reviewed. -Patient to follow up in 2 weeks for nail check.  Procedure: Excision of entire/total nail With phenol matricectomy Location: Left 1st toe digit Anesthesia:  Lidocaine 1% plain; 1.5 mL and Marcaine 0.5% plain; 1.5 mL, digital block. Skin Prep: Betadine. Dressing: Silvadene; telfa; dry, sterile, compression dressing. Technique: Following skin prep, the toe was exsanguinated and a tourniquet was secured at the base of the toe. The affected nail border was freed and excised.  Phenol matricectomy was performed in standard technique.  The tourniquet was then removed and sterile dressing applied. Disposition: Patient tolerated procedure well. Patient to return in 2 weeks for follow-up.   No follow-ups on file.   No follow-ups on file.

## 2020-08-27 ENCOUNTER — Ambulatory Visit: Payer: Medicaid Other

## 2020-09-01 ENCOUNTER — Other Ambulatory Visit: Payer: Self-pay

## 2020-09-01 ENCOUNTER — Ambulatory Visit: Payer: Medicaid Other

## 2020-09-01 DIAGNOSIS — M5416 Radiculopathy, lumbar region: Secondary | ICD-10-CM

## 2020-09-01 DIAGNOSIS — R262 Difficulty in walking, not elsewhere classified: Secondary | ICD-10-CM

## 2020-09-01 DIAGNOSIS — M545 Low back pain, unspecified: Secondary | ICD-10-CM

## 2020-09-01 DIAGNOSIS — M6281 Muscle weakness (generalized): Secondary | ICD-10-CM

## 2020-09-01 NOTE — Therapy (Signed)
[facility] [address], [place], [address] Phone: [phone]   Fax:  [phone]  Physical Therapy Treatment  Patient Details  Name: Brittany Holland MRN: [id number] Date of Birth: 08-29-1981 Referring Provider (PT): Gennette Pac, FNP   Encounter Date: 09/01/2020   PT End of Session - 09/01/20 1350    Visit Number 5    Number of Visits 8    Date for PT Re-Evaluation 09/04/20    Authorization Type 3    Authorization Time Period of 4 Medicaid until 09/04/2020    PT Start Time 1351    PT Stop Time 1431    PT Time Calculation (min) 40 min    Activity Tolerance Patient tolerated treatment well    Behavior During Therapy [facility] for tasks assessed/performed           No past medical history on file.  Past Surgical History:  Procedure Laterality Date  . CESAREAN SECTION     x 2    There were no vitals filed for this visit.   Subjective Assessment - 09/01/20 1352    Subjective Had a small surgery L great toe in which her L great toe toe nail was removed completely last Tuesday.  Low back feels ok. No pain currently in sitting and walking from waiting room to treatment room.  4/10 low back pain at most for the past 7 days. 6/10 R LE pain at most for the past 7 days. 4/10 R LE pain currently (R L4 dermatome)    Pertinent History Low back pain with radiating symptoms. Pain along R low back, posterior hip radiating to proximal medial leg (along the L 4 dermatome). Sudden onset in June 2021. Pt just started having sharp pain in her back when reaching up with her R UE in the shower. Does a lot of walking at work. Works 8 hours. Does not really do heavy lifting. 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 Has not had back pain before. Pt was told to have sciatic nerve pain on one side of the body. Denies loss of bowel or bladder control. Denies saddle  anesthesia.    Patient Stated Goals Be able to get into and out of the care more comfortably.    Currently in Pain? Yes    Pain Score 4     Pain Location Leg    Pain Orientation Right;Medial    Pain Onset More than a month ago              [facility] PT Assessment - 09/01/20 1759      Observation/Other Assessments   Focus on Therapeutic Outcomes (FOTO)  Lumbar FOTO 52                                 PT Education - 09/01/20 1650    Education Details ther-ex    Person(s) Educated Patient    Methods Explanation;Demonstration;Tactile cues;Verbal cues    Comprehension Returned demonstration;Verbalized understanding           Objective   No latex band allergies  MedbridgeAccess Code [email or web address]  Most recent C-section 2007  Medicaid Healthy Blue approval 4 visits from 08/06/2020 to 09/04/2020   Therapeutic exercise  Manually resisted S/L hip abduction, prone hip extension 1-2x each way for each LE  Hip abduction: 4+/5 R, 4+/5  L; hip extension 4/5 R, 4/5  Reviewed progress with hip strength with pt.   Improved B Hip extension and abduction strength.   Prone glute max extension   R 10x3  L 10x3   S/L hip abduction   R 10x3  L 10x3. Rest breaks as needed. More difficult for L LE.   Bridge 10x3  hooklying crunches, hands on sides to promote core strength  10x3  Seated L lateral shift isometrics in neutral 10x3 with 5 second holds  Standing gentle lumbar extension 10x5 seconds for 3 sets  Seated trunk flexion: decreased R LE symptoms.   Improved exercise technique, movement at target joints, use of target muscles after mod verbal, visual, tactile cues.      Response to treatment Pt tolerated session well without aggravation of symptoms.   Clinical impression  Pt demonstrates improved bilateral hip strength, overall decreased low back and R LE pain since initial evaluation. Pt making very good progress with PT  towards pain and hip strength goals. Continued working on glute and trunk strength to help decrease stress to low back. Pt will benefit from continued skilled physical therapy services to decrease pain, improve strength and function.     PT Short Term Goals - 08/18/20 0908      PT SHORT TERM GOAL #1   Title Patient will be able to perform her HEP consistently to decrease pain, and improve function.    Baseline Pt has started her HEP. (07/24/2020); Has been doing her HEP consistently (08/18/2020)    Time 3    Period Weeks    Status Achieved    Target Date 08/14/20             PT Long Term Goals - 08/18/20 0909      PT LONG TERM GOAL #1   Title Patient will have a decrease in low back pain to 3/10 or less at worst to promote ability to get into and out of her car as well as perform tasks which involve bending over more comfortably.  R LE is about 7/10 currently.    Baseline 7/10 low back pain at most for the past month (07/24/2020); 6/10 at most for the past 7 days (08/18/2020)    Time 2    Period Weeks    Status Partially Met    Target Date 09/04/20      PT LONG TERM GOAL #2   Title Patient will have a decrease in  RLE pain to 3/10 or less at worst to promote ability to get into and out of her car as well as perform tasks which involve bending over more comfortably.    Baseline 8/10 R LE pain at most for the past month without pain medication (07/24/2020); 7/10 at most for the past 7 days (08/18/2020)    Time 2    Period Weeks    Status Partially Met    Target Date 09/04/20      PT LONG TERM GOAL #3   Title Patient will improve bilateral hip extension and abduction strength by at least 1/2 MMT grade to promote ability to perform standing tasks more comfortably for her back and R LE.    Baseline Hip extension 4-/5 bilaterally, hip abduction 3-/5 R, 4-/5 L (07/24/2020); Hip abduction 4/5 R, 4/5 L, hip extension 4-/5 R,4-/5 L (08/18/2020)    Time 2    Period Weeks    Status Partially Met     Target Date 09/04/20  PT LONG TERM GOAL #4   Title Patient will improve her lumbar FOTO by at least 10 points as a demonstration of improved function.    Baseline Lumbar FOTO 56 (07/24/2020); 56 (08/18/2020)    Time 2    Period Weeks    Status On-going    Target Date 09/04/20                 Plan - 09/01/20 1651    Clinical Impression Statement Pt demonstrates improved bilateral hip strength, overall decreased low back and R LE pain since initial evaluation. Pt making very good progress with PT towards pain and hip strength goals. Continued working on glute and trunk strength to help decrease stress to low back. Pt will benefit from continued skilled physical therapy services to decrease pain, improve strength and function.    Personal Factors and Comorbidities Fitness    Examination-Activity Limitations Bed Mobility;Bathing;Hygiene/Grooming;Squat;Lift;Bend;Stand;Reach Overhead;Carry;Transfers    Stability/Clinical Decision Making Stable/Uncomplicated    Rehab Potential Fair    PT Frequency 2x / week    PT Duration Other (comment)   2-3 weeks   PT Treatment/Interventions Therapeutic activities;Therapeutic exercise;Neuromuscular re-education;Patient/family education;Manual techniques;Dry needling;Spinal Manipulations;Wettstein Manipulations;Aquatic Therapy;Electrical Stimulation;Iontophoresis 67m/ml Dexamethasone;Traction;Ultrasound;Gait training;Stair training;Functional mobility training    PT Next Visit Plan trunk, core, hip strengthening, lumbopelvic control, manual techniques, modalities PRN    PT Home Exercise Plan Medbridge Access Code [email or web address]    Consulted and Agree with Plan of Care Patient           Patient will benefit from skilled therapeutic intervention in order to improve the following deficits and impairments:  Pain, Postural dysfunction, Improper body mechanics, Difficulty walking, Decreased strength, Abnormal gait, Decreased activity tolerance  Visit  Diagnosis: Acute bilateral low back pain, unspecified whether sciatica present  Radiculopathy, lumbar region  Muscle weakness (generalized)  Difficulty in walking, not elsewhere classified     Problem List Patient Active Problem List   Diagnosis Date Noted  . Abnormal Pap smear 08/21/2020  . Female pelvic pain 08/21/2020  . Bacterial vaginosis 04/13/2012  . Oral yeast infection 04/13/2012  . GERD (gastroesophageal reflux disease) 07/15/2011   MJoneen BoersPT, DPT   09/01/2020, 6:10 PM  [facility] [address] [place] [address]Phone: [phone]  Fax:  [phone] Name: MKimberlynnJoint MRN: [id number] of Birth: [birth year]

## 2020-09-04 ENCOUNTER — Ambulatory Visit: Payer: Medicaid Other

## 2020-09-04 ENCOUNTER — Other Ambulatory Visit: Payer: Self-pay

## 2020-09-04 DIAGNOSIS — M545 Low back pain, unspecified: Secondary | ICD-10-CM

## 2020-09-04 DIAGNOSIS — R262 Difficulty in walking, not elsewhere classified: Secondary | ICD-10-CM

## 2020-09-04 DIAGNOSIS — M6281 Muscle weakness (generalized): Secondary | ICD-10-CM

## 2020-09-04 DIAGNOSIS — M5416 Radiculopathy, lumbar region: Secondary | ICD-10-CM

## 2020-09-04 NOTE — Patient Instructions (Addendum)
Access Code: [email or web address] URL: [email or web address] Date: 09/04/2020 Prepared by: Loralyn Freshwater  Exercises Seated Transversus Abdominis Bracing - 10 x daily - 7 x weekly - 3 sets - 10 reps - 5 seconds hold Supine Posterior Pelvic Tilt - 1 x daily - 7 x weekly - 3 sets - 10 reps - 5 seconds hold Bent Knee Fallouts - 1 x daily - 7 x weekly - 3 sets - 10 reps Prone Quadriceps Set - 1 x daily - 7 x weekly - 3 sets - 10 reps - 5 seconds hold Sidelying Hip Abduction - 1 x daily - 7 x weekly - 3 sets - 10 reps Seated Hip Hinge with Dowel - 1 x daily - 7 x weekly - 3 sets - 10 reps Shoulder Extension with Resistance - 1 x daily - 7 x weekly - 3 sets - 10 reps - 5 seconds hold

## 2020-09-04 NOTE — Therapy (Signed)
[facility] [address], [place], [address] Phone: [phone]   Fax:  [phone]  Physical Therapy Treatment  Patient Details  Name: Brittany Holland MRN: [id number] Date of Birth: Apr 17, 1981 Referring Provider (PT): Gennette Pac, FNP   Encounter Date: 09/04/2020   PT End of Session - 09/04/20 0806    Visit Number 6    Number of Visits 20    Date for PT Re-Evaluation 10/16/20    Authorization Type 4    Authorization Time Period of 4 Medicaid until 09/04/2020    PT Start Time 0806    PT Stop Time 0847    PT Time Calculation (min) 41 min    Activity Tolerance Patient tolerated treatment well    Behavior During Therapy [facility] for tasks assessed/performed            No past medical history on file.  Past Surgical History:  Procedure Laterality Date  . CESAREAN SECTION     x 2    There were no vitals filed for this visit.   Subjective Assessment - 09/04/20 0808    Subjective Has some pain in her back, 4/10 currently. No R LE symptoms currently when sitting, 6/10 when standing. Needs her MD to prescribe her more medication.    Pertinent History Low back pain with radiating symptoms. Pain along R low back, posterior hip radiating to proximal medial leg (along the L 4 dermatome). Sudden onset in June 2021. Pt just started having sharp pain in her back when reaching up with her R UE in the shower. Does a lot of walking at work. Works 8 hours. Does not really do heavy lifting. 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 Has not had back pain before. Pt was told to have sciatic nerve pain on one side of the body. Denies loss of bowel or bladder control. Denies saddle anesthesia.    Patient Stated Goals Be able to get into and out of the care more comfortably.    Currently in Pain? Yes    Pain Score 6    R LE when standing, L4 dermatome.   Pain Onset  More than a month ago                                     PT Education - 09/04/20 0829    Education Details ther-ex, HEP    Person(s) Educated Patient    Methods Explanation;Demonstration;Tactile cues;Verbal cues;Handout    Comprehension Returned demonstration;Verbalized understanding          Objective   No latex band allergies  MedbridgeAccess Code [email or web address]  Most recent C-section 2007  Medicaid Healthy Blue approval 4 visits from 08/06/2020 to 09/04/2020   Therapeutic exercise   Reviewed progress/current status with PT towards goals  Standing posterior pelvic tilt: increased R LE symptoms   Standing back extension 10x5 seconds   Standing L lateral shift correction 10x5 seconds for 2 sets  Seated hip hinging 10x3  Pallof press  Red band L side 10x5 second holds for 3 sets  Slight increase in symptoms, eases with rest   Standing B shoulder extension red band 10x5 seconds for 3 sets   Decreased R LE symptoms     Response to treatment Pt tolerated session well without aggravation of symptoms.   Clinical  impression  Decreased symptoms with activation of core and glute muscles. Pt demonstrates improved bilateral hip strength, overall decreased low back and R LE pain since initial evaluation. Pt making very good progress with PT towards low back pain. Continued working on trunk and glute strength as well as lumbopelvic control to promote better mechanics at her back with movement to help continue progress. Pt will benefit from continued skilled physical therapy srevices to decrease pain, improve strength and function.         PT Short Term Goals - 08/18/20 0908      PT SHORT TERM GOAL #1   Title Patient will be able to perform her HEP consistently to decrease pain, and improve function.    Baseline Pt has started her HEP. (07/24/2020); Has been doing her HEP consistently (08/18/2020)    Time 3    Period Weeks    Status  Achieved    Target Date 08/14/20             PT Long Term Goals - 09/04/20 0809      PT LONG TERM GOAL #1   Title Patient will have a decrease in low back pain to 3/10 or less at worst to promote ability to get into and out of her car as well as perform tasks which involve bending over more comfortably.  R LE is about 7/10 currently.    Baseline 7/10 low back pain at most for the past month (07/24/2020); 6/10 at most for the past 7 days (08/18/2020); 5/10 at most for the past 7 days (09/04/2020)    Time 6    Period Weeks    Status Partially Met    Target Date 10/16/20      PT LONG TERM GOAL #2   Title Patient will have a decrease in  RLE pain to 3/10 or less at worst to promote ability to get into and out of her car as well as perform tasks which involve bending over more comfortably.    Baseline 8/10 R LE pain at most for the past month without pain medication (07/24/2020); 7/10 at most for the past 7 days (08/18/2020), 7/10 at most for the past 7 days (09/04/2020)    Time 6    Period Weeks    Status Partially Met    Target Date 10/16/20      PT LONG TERM GOAL #3   Title Patient will improve bilateral hip extension and abduction strength by at least 1/2 MMT grade to promote ability to perform standing tasks more comfortably for her back and R LE.    Baseline Hip extension 4-/5 bilaterally, hip abduction 3-/5 R, 4-/5 L (07/24/2020); Hip abduction 4/5 R, 4/5 L, hip extension 4-/5 R,4-/5 L (08/18/2020); Hip abduction: 4+/5 R, 4+/5 L; hip extension 4/5 R, 4/5 L  (09/01/2020)    Time 2    Period Weeks    Status Achieved    Target Date 09/04/20      PT LONG TERM GOAL #4   Title Patient will improve her lumbar FOTO by at least 10 points as a demonstration of improved function.    Baseline Lumbar FOTO 56 (07/24/2020); 56 (08/18/2020);Lumbar FOTO 52  (09/01/2020)    Time 6    Period Weeks    Status On-going    Target Date 10/16/20                 Plan - 09/04/20 0829    Clinical  Impression Statement Decreased symptoms  with activation of core and glute muscles. Pt demonstrates improved bilateral hip strength, overall decreased low back and R LE pain since initial evaluation. Pt making very good progress with PT towards low back pain. Continued working on trunk and glute strength as well as lumbopelvic control to promote better mechanics at her back with movement to help continue progress. Pt will benefit from continued skilled physical therapy srevices to decrease pain, improve strength and function.    Personal Factors and Comorbidities Fitness    Examination-Activity Limitations Bed Mobility;Bathing;Hygiene/Grooming;Squat;Lift;Bend;Stand;Reach Overhead;Carry;Transfers    Stability/Clinical Decision Making Stable/Uncomplicated    Clinical Decision Making Low    Rehab Potential Fair    PT Frequency 2x / week    PT Duration 6 weeks    PT Treatment/Interventions Therapeutic activities;Therapeutic exercise;Neuromuscular re-education;Patient/family education;Manual techniques;Dry needling;Spinal Manipulations;Pinkett Manipulations;Aquatic Therapy;Electrical Stimulation;Iontophoresis 31m/ml Dexamethasone;Traction;Ultrasound;Gait training;Stair training;Functional mobility training    PT Next Visit Plan trunk, core, hip strengthening, lumbopelvic control, manual techniques, modalities PRN    PT Home Exercise Plan Medbridge Access Code [email or web address]    Consulted and Agree with Plan of Care Patient           Patient will benefit from skilled therapeutic intervention in order to improve the following deficits and impairments:  Pain, Postural dysfunction, Improper body mechanics, Difficulty walking, Decreased strength, Abnormal gait, Decreased activity tolerance  Visit Diagnosis: Acute bilateral low back pain, unspecified whether sciatica present - Plan: PT plan of care cert/re-cert  Radiculopathy, lumbar region - Plan: PT plan of care cert/re-cert  Muscle weakness (generalized) -  Plan: PT plan of care cert/re-cert  Difficulty in walking, not elsewhere classified - Plan: PT plan of care cert/re-cert     Problem List Patient Active Problem List   Diagnosis Date Noted  . Abnormal Pap smear 08/21/2020  . Female pelvic pain 08/21/2020  . Bacterial vaginosis 04/13/2012  . Oral yeast infection 04/13/2012  . GERD (gastroesophageal reflux disease) 07/15/2011    MJoneen BoersPT, DPT   09/04/2020, 12:26 PM  [facility] [address] [place] [address]Phone: [phone]  Fax:  [phone] Name: MAlilahJoint MRN: [id number] of Birth: [birth year]

## 2020-09-11 ENCOUNTER — Ambulatory Visit: Payer: Medicaid Other

## 2020-09-15 ENCOUNTER — Ambulatory Visit: Payer: Medicaid Other

## 2020-09-17 ENCOUNTER — Other Ambulatory Visit: Payer: Self-pay

## 2020-09-17 ENCOUNTER — Ambulatory Visit: Payer: Medicaid Other | Attending: Family Medicine

## 2020-09-17 DIAGNOSIS — M6281 Muscle weakness (generalized): Secondary | ICD-10-CM | POA: Diagnosis present

## 2020-09-17 DIAGNOSIS — M545 Low back pain, unspecified: Secondary | ICD-10-CM | POA: Diagnosis present

## 2020-09-17 DIAGNOSIS — R262 Difficulty in walking, not elsewhere classified: Secondary | ICD-10-CM

## 2020-09-17 DIAGNOSIS — M5416 Radiculopathy, lumbar region: Secondary | ICD-10-CM

## 2020-09-17 NOTE — Therapy (Signed)
[facility] [address] [place], [address] Phone: [phone]   Fax:  [phone]  Physical Therapy Treatment  Patient Details  Name: Brittany Holland MRN: [id number] Date of Birth: 1981-11-23 Referring Provider (PT): Gennette Pac, FNP   Encounter Date: 09/17/2020   PT End of Session - 09/17/20 0944    Visit Number 7    Number of Visits 20    Date for PT Re-Evaluation 10/16/20    Authorization Type 1    Authorization Time Period of 14 Medicaid until 10/05/2020    PT Start Time 0944    PT Stop Time 1026    PT Time Calculation (min) 42 min    Activity Tolerance Patient tolerated treatment well    Behavior During Therapy [facility] for tasks assessed/performed           No past medical history on file.  Past Surgical History:  Procedure Laterality Date  . CESAREAN SECTION     x 2    There were no vitals filed for this visit.   Subjective Assessment - 09/17/20 0946    Subjective Back is doing good. 3/10 back pain currently, 5/10 R LE pain currently sitting and standing.    Pertinent History Low back pain with radiating symptoms. Pain along R low back, posterior hip radiating to proximal medial leg (along the L 4 dermatome). Sudden onset in June 2021. Pt just started having sharp pain in her back when reaching up with her R UE in the shower. Does a lot of walking at work. Works 8 hours. Does not really do heavy lifting. 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 Has not had back pain before. Pt was told to have sciatic nerve pain on one side of the body. Denies loss of bowel or bladder control. Denies saddle anesthesia.    Patient Stated Goals Be able to get into and out of the care more comfortably.    Currently in Pain? Yes    Pain Score 5     Pain Orientation Right    Pain Onset More than a month ago                                      PT Education - 09/17/20 0952    Education Details ther-ex, HEP    Person(s) Educated Patient    Methods Explanation;Demonstration;Tactile cues;Verbal cues;Handout    Comprehension Returned demonstration;Verbalized understanding          Objective   No latex band allergies  MedbridgeAccess Code [email or web address]  Most recent C-section 2007  Medicaid Healthy Blue approval 4 visits from 08/06/2020 to 09/04/2020   Therapeutic exercise  Standing hip flexor stretch  R 30 seconds x 3  L 30 seconds x 3  Static mini lunge  R 10x3  L 10x3   Crunches 10x3 Bridge 10x3  Planks 10 seconds, 7 seconds x 3  Prone glute max extension   R 10x2  L 10x2   Standing B shoulder extension red band 10x5 seconds for 3 sets             Decreased R LE symptoms   SLS with B UE assist   R 10x5 seconds   L 10x5 seconds  Response to treatment Fair tolerance to today's session. Decreased R LE symptoms with transversus abdominis and glute max contraction to decrease low back extension stress.    Clinical impression  Worked on improving hip extension ROM and core and glute strength to help decrease extension stress to low back. Fair tolerance to today's session. Decreased symptoms with decreasing low back extension stress.  Pt will benefit form continued skilled physical therapy services to decrease pain, improve strength and function.      PT Short Term Goals - 08/18/20 0908      PT SHORT TERM GOAL #1   Title Patient will be able to perform her HEP consistently to decrease pain, and improve function.    Baseline Pt has started her HEP. (07/24/2020); Has been doing her HEP consistently (08/18/2020)    Time 3    Period Weeks    Status Achieved    Target Date 08/14/20             PT Long Term Goals - 09/04/20 0809      PT LONG TERM GOAL #1   Title Patient will have a decrease in low back pain to 3/10 or less  at worst to promote ability to get into and out of her car as well as perform tasks which involve bending over more comfortably.  R LE is about 7/10 currently.    Baseline 7/10 low back pain at most for the past month (07/24/2020); 6/10 at most for the past 7 days (08/18/2020); 5/10 at most for the past 7 days (09/04/2020)    Time 6    Period Weeks    Status Partially Met    Target Date 10/16/20      PT LONG TERM GOAL #2   Title Patient will have a decrease in  RLE pain to 3/10 or less at worst to promote ability to get into and out of her car as well as perform tasks which involve bending over more comfortably.    Baseline 8/10 R LE pain at most for the past month without pain medication (07/24/2020); 7/10 at most for the past 7 days (08/18/2020), 7/10 at most for the past 7 days (09/04/2020)    Time 6    Period Weeks    Status Partially Met    Target Date 10/16/20      PT LONG TERM GOAL #3   Title Patient will improve bilateral hip extension and abduction strength by at least 1/2 MMT grade to promote ability to perform standing tasks more comfortably for her back and R LE.    Baseline Hip extension 4-/5 bilaterally, hip abduction 3-/5 R, 4-/5 L (07/24/2020); Hip abduction 4/5 R, 4/5 L, hip extension 4-/5 R,4-/5 L (08/18/2020); Hip abduction: 4+/5 R, 4+/5 L; hip extension 4/5 R, 4/5 L  (09/01/2020)    Time 2    Period Weeks    Status Achieved    Target Date 09/04/20      PT LONG TERM GOAL #4   Title Patient will improve her lumbar FOTO by at least 10 points as a demonstration of improved function.    Baseline Lumbar FOTO 56 (07/24/2020); 56 (08/18/2020);Lumbar FOTO 52  (09/01/2020)    Time 6    Period Weeks    Status On-going    Target Date 10/16/20                 Plan - 09/17/20 0952    Clinical Impression Statement Worked on improving hip extension ROM  and core and glute strength to help decrease extension stress to low back. Fair tolerance to today's session. Decreased symptoms with  decreasing low back extension stress.  Pt will benefit form continued skilled physical therapy services to decrease pain, improve strength and function.    Personal Factors and Comorbidities Fitness    Examination-Activity Limitations Bed Mobility;Bathing;Hygiene/Grooming;Squat;Lift;Bend;Stand;Reach Overhead;Carry;Transfers    Stability/Clinical Decision Making Stable/Uncomplicated    Rehab Potential Fair    PT Frequency 2x / week    PT Duration 6 weeks    PT Treatment/Interventions Therapeutic activities;Therapeutic exercise;Neuromuscular re-education;Patient/family education;Manual techniques;Dry needling;Spinal Manipulations;Paules Manipulations;Aquatic Therapy;Electrical Stimulation;Iontophoresis 76m/ml Dexamethasone;Traction;Ultrasound;Gait training;Stair training;Functional mobility training    PT Next Visit Plan trunk, core, hip strengthening, lumbopelvic control, manual techniques, modalities PRN    PT Home Exercise Plan Medbridge Access Code [email or web address]    Consulted and Agree with Plan of Care Patient           Patient will benefit from skilled therapeutic intervention in order to improve the following deficits and impairments:  Pain, Postural dysfunction, Improper body mechanics, Difficulty walking, Decreased strength, Abnormal gait, Decreased activity tolerance  Visit Diagnosis: Acute bilateral low back pain, unspecified whether sciatica present  Radiculopathy, lumbar region  Muscle weakness (generalized)  Difficulty in walking, not elsewhere classified     Problem List Patient Active Problem List   Diagnosis Date Noted  . Abnormal Pap smear 08/21/2020  . Female pelvic pain 08/21/2020  . Bacterial vaginosis 04/13/2012  . Oral yeast infection 04/13/2012  . GERD (gastroesophageal reflux disease) 07/15/2011    MJoneen BoersPT, DPT   09/17/2020, 12:42 PM  [facility] [address] [place]  [id number]: [phone]  Fax:  [phone] Name: Brittany Holland MRN: [id number] of Birth: [birth year]

## 2020-09-17 NOTE — Patient Instructions (Addendum)
  Access Code: [email or web address] URL: [email or web address] Date: 09/17/2020 Prepared by: Loralyn Freshwater  Exercises Seated Transversus Abdominis Bracing - 10 x daily - 7 x weekly - 3 sets - 10 reps - 5 seconds hold Supine Posterior Pelvic Tilt - 1 x daily - 7 x weekly - 3 sets - 10 reps - 5 seconds hold Bent Knee Fallouts - 1 x daily - 7 x weekly - 3 sets - 10 reps Prone Quadriceps Set - 1 x daily - 7 x weekly - 3 sets - 10 reps - 5 seconds hold Sidelying Hip Abduction - 1 x daily - 7 x weekly - 3 sets - 10 reps Seated Hip Hinge with Dowel - 1 x daily - 7 x weekly - 3 sets - 10 reps Shoulder Extension with Resistance - 1 x daily - 7 x weekly - 3 sets - 10 reps - 5 seconds hold Standing Hip Flexor Stretch - 3 x daily - 7 x weekly - 1 sets - 5 reps - 30 seconds hold Static Lunge - 1 x daily - 7 x weekly - 3 sets - 10 reps Supine Bridge - 1 x daily - 7 x weekly - 3 sets - 10 reps

## 2020-09-22 ENCOUNTER — Ambulatory Visit: Payer: Medicaid Other

## 2020-09-24 ENCOUNTER — Other Ambulatory Visit: Payer: Self-pay

## 2020-09-24 ENCOUNTER — Ambulatory Visit: Payer: Medicaid Other

## 2020-09-24 DIAGNOSIS — R262 Difficulty in walking, not elsewhere classified: Secondary | ICD-10-CM

## 2020-09-24 DIAGNOSIS — M545 Low back pain, unspecified: Secondary | ICD-10-CM

## 2020-09-24 DIAGNOSIS — M6281 Muscle weakness (generalized): Secondary | ICD-10-CM

## 2020-09-24 DIAGNOSIS — M5416 Radiculopathy, lumbar region: Secondary | ICD-10-CM

## 2020-09-24 NOTE — Therapy (Signed)
[facility] [address] [place], [address] Phone: [phone]   Fax:  [phone]  Physical Therapy Treatment  Patient Details  Name: Brittany Holland MRN: [id number] Date of Birth: 02-Sep-1981 Referring Provider (PT): Gennette Pac, FNP   Encounter Date: 09/24/2020   PT End of Session - 09/24/20 1356    Visit Number 8    Number of Visits 20    Date for PT Re-Evaluation 10/16/20    Authorization Type 2    Authorization Time Period of 14 Medicaid until 10/05/2020    PT Start Time 7209   pt arrived late   PT Stop Time 1427    PT Time Calculation (min) 31 min    Activity Tolerance Patient tolerated treatment well    Behavior During Therapy [facility] for tasks assessed/performed           No past medical history on file.  Past Surgical History:  Procedure Laterality Date   CESAREAN SECTION     x 2    There were no vitals filed for this visit.   Subjective Assessment - 09/24/20 1357    Subjective Back is good. Small bit of pain, 3/10 currently. 5/10 R LE pain. R LE pain is more towards the hip now, not too much at the knee (L4 dermatome).    Pertinent History Low back pain with radiating symptoms. Pain along R low back, posterior hip radiating to proximal medial leg (along the L 4 dermatome). Sudden onset in June 2021. Pt just started having sharp pain in her back when reaching up with her R UE in the shower. Does a lot of walking at work. Works 8 hours. Does not really do heavy lifting. 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 Has not had back pain before. Pt was told to have sciatic nerve pain on one side of the body. Denies loss of bowel or bladder control. Denies saddle anesthesia.    Patient Stated Goals Be able to get into and out of the care more comfortably.    Currently in Pain? Yes    Pain Score 5     Pain Onset More than a month ago                                      PT Education - 09/24/20 1358    Education Details ther-ex    Person(s) Educated Patient    Methods Explanation;Demonstration;Tactile cues;Verbal cues    Comprehension Returned demonstration;Verbalized understanding          Objective   No latex band allergies  MedbridgeAccess Code [email or web address]  Most recent C-section 2007  Medicaid Healthy Blue approval 4 visits from 08/06/2020 to 09/04/2020   Therapeutic exercise  Standing hip flexor stretch             R 30 seconds x 3              L 30 seconds x 3   Prone glute max extension              R 10x2, then 5x             L 10x2, then 5x   Crunches 10x3, arms around chest  Bridge 10x3  Seated manually resisted L latearl shift isometrics in neutral to counter R  lateral shift posture 10x3 with 5 seconds    Then with L upper trunk rotation to promote R lower trunk rotation 10x5 seconds for 3 sets  Decreased R LE symptoms   Standing PNF chops to the L to promote R lower trunk rotation, red band  10x5 seconds for 2 sets   Improved exercise technique, movement at target joints, use of target muscles after min to mod verbal, visual, tactile cues.     Response to treatment Pt tolerated session well without aggravation of symptoms.    Clinical impression Pt arrived late and session was adjusted accordingly. Centralizing R LE L4 symptoms to mainly at her R hip based on subjective reports. Continued improving posture, hip extension ROM, core and glute strength to decrease stress to low back. Pt will benefit from continued skilled physical therapy services to decrease pain, improve strength and function.        PT Short Term Goals - 08/18/20 0908      PT SHORT TERM GOAL #1   Title Patient will be able to perform her HEP consistently to decrease pain, and improve function.    Baseline Pt has started her HEP. (07/24/2020); Has been doing her  HEP consistently (08/18/2020)    Time 3    Period Weeks    Status Achieved    Target Date 08/14/20             PT Long Term Goals - 09/04/20 0809      PT LONG TERM GOAL #1   Title Patient will have a decrease in low back pain to 3/10 or less at worst to promote ability to get into and out of her car as well as perform tasks which involve bending over more comfortably.  R LE is about 7/10 currently.    Baseline 7/10 low back pain at most for the past month (07/24/2020); 6/10 at most for the past 7 days (08/18/2020); 5/10 at most for the past 7 days (09/04/2020)    Time 6    Period Weeks    Status Partially Met    Target Date 10/16/20      PT LONG TERM GOAL #2   Title Patient will have a decrease in  RLE pain to 3/10 or less at worst to promote ability to get into and out of her car as well as perform tasks which involve bending over more comfortably.    Baseline 8/10 R LE pain at most for the past month without pain medication (07/24/2020); 7/10 at most for the past 7 days (08/18/2020), 7/10 at most for the past 7 days (09/04/2020)    Time 6    Period Weeks    Status Partially Met    Target Date 10/16/20      PT LONG TERM GOAL #3   Title Patient will improve bilateral hip extension and abduction strength by at least 1/2 MMT grade to promote ability to perform standing tasks more comfortably for her back and R LE.    Baseline Hip extension 4-/5 bilaterally, hip abduction 3-/5 R, 4-/5 L (07/24/2020); Hip abduction 4/5 R, 4/5 L, hip extension 4-/5 R,4-/5 L (08/18/2020); Hip abduction: 4+/5 R, 4+/5 L; hip extension 4/5 R, 4/5 L  (09/01/2020)    Time 2    Period Weeks    Status Achieved    Target Date 09/04/20      PT LONG TERM GOAL #4   Title Patient will improve her lumbar FOTO by at least 10 points as  a demonstration of improved function.    Baseline Lumbar FOTO 56 (07/24/2020); 56 (08/18/2020);Lumbar FOTO 52  (09/01/2020)    Time 6    Period Weeks    Status On-going    Target Date  10/16/20                 Plan - 09/24/20 1359    Clinical Impression Statement Pt arrived late and session was adjusted accordingly. Centralizing R LE L4 symptoms to mainly at her R hip based on subjective reports. Continued improving posture, hip extension ROM, core and glute strength to decrease stress to low back. Pt will benefit from continued skilled physical therapy services to decrease pain, improve strength and function.    Personal Factors and Comorbidities Fitness    Examination-Activity Limitations Bed Mobility;Bathing;Hygiene/Grooming;Squat;Lift;Bend;Stand;Reach Overhead;Carry;Transfers    Stability/Clinical Decision Making Stable/Uncomplicated    Rehab Potential Fair    PT Frequency 2x / week    PT Duration 6 weeks    PT Treatment/Interventions Therapeutic activities;Therapeutic exercise;Neuromuscular re-education;Patient/family education;Manual techniques;Dry needling;Spinal Manipulations;Rohlman Manipulations;Aquatic Therapy;Electrical Stimulation;Iontophoresis 54m/ml Dexamethasone;Traction;Ultrasound;Gait training;Stair training;Functional mobility training    PT Next Visit Plan trunk, core, hip strengthening, lumbopelvic control, manual techniques, modalities PRN    PT Home Exercise Plan Medbridge Access Code [email or web address]    Consulted and Agree with Plan of Care Patient           Patient will benefit from skilled therapeutic intervention in order to improve the following deficits and impairments:  Pain, Postural dysfunction, Improper body mechanics, Difficulty walking, Decreased strength, Abnormal gait, Decreased activity tolerance  Visit Diagnosis: Acute bilateral low back pain, unspecified whether sciatica present  Radiculopathy, lumbar region  Muscle weakness (generalized)  Difficulty in walking, not elsewhere classified     Problem List Patient Active Problem List   Diagnosis Date Noted   Abnormal Pap smear 08/21/2020   Female pelvic pain 08/21/2020    Bacterial vaginosis 04/13/2012   Oral yeast infection 04/13/2012   GERD (gastroesophageal reflux disease) 07/15/2011    MJoneen BoersPT, DPT   09/24/2020, 3:47 PM  [facility] [address] [place] [address]Phone: [phone]  Fax:  [phone] Name: MBreellaJoint MRN: [id number] of Birth: 306/24/1982

## 2020-09-29 ENCOUNTER — Ambulatory Visit: Payer: Medicaid Other

## 2020-10-01 ENCOUNTER — Ambulatory Visit: Payer: Medicaid Other

## 2020-10-01 ENCOUNTER — Other Ambulatory Visit: Payer: Self-pay

## 2020-10-01 DIAGNOSIS — M545 Low back pain, unspecified: Secondary | ICD-10-CM | POA: Diagnosis not present

## 2020-10-01 DIAGNOSIS — R262 Difficulty in walking, not elsewhere classified: Secondary | ICD-10-CM

## 2020-10-01 DIAGNOSIS — M6281 Muscle weakness (generalized): Secondary | ICD-10-CM

## 2020-10-01 DIAGNOSIS — M5416 Radiculopathy, lumbar region: Secondary | ICD-10-CM

## 2020-10-01 NOTE — Patient Instructions (Signed)
Seated hip extension isometrics   Sitting on a chair,    Squeeze your rear end muscles together and press your right foot onto the floor.     Hold for 5 seconds    Repeat 10 times   Perform 3 sets daily.      This is a corrective exercise. Once you no longer have symptoms, you can stop.   

## 2020-10-01 NOTE — Therapy (Signed)
[facility] [address], [place], [address] Phone: [phone]   Fax:  [phone]  Physical Therapy Treatment  Patient Details  Name: Brittany Holland MRN: [id number] Date of Birth: 1980-12-23 Referring Provider (PT): Gennette Pac, FNP   Encounter Date: 10/01/2020   PT End of Session - 10/01/20 1356    Visit Number 9    Number of Visits 20    Date for PT Re-Evaluation 10/16/20    Authorization Type 3    Authorization Time Period of 14 Medicaid until 10/05/2020    PT Start Time 4166   pt arrived late   PT Stop Time 1427    PT Time Calculation (min) 31 min    Activity Tolerance Patient tolerated treatment well    Behavior During Therapy [facility] for tasks assessed/performed           No past medical history on file.  Past Surgical History:  Procedure Laterality Date  . CESAREAN SECTION     x 2    There were no vitals filed for this visit.   Subjective Assessment - 10/01/20 1357    Subjective Back is doing good. 4/10 low back pain currently. 6/10 R LE (L4) pain currently (pt sitting on a chair). Getting out of bed is getting better. Getting into and out of a car is better.  Lying down on her stomach is better. Bending over has gotten better.    Pertinent History Low back pain with radiating symptoms. Pain along R low back, posterior hip radiating to proximal medial leg (along the L 4 dermatome). Sudden onset in June 2021. Pt just started having sharp pain in her back when reaching up with her R UE in the shower. Does a lot of walking at work. Works 8 hours. Does not really do heavy lifting. 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 Has not had back pain before. Pt was told to have sciatic nerve pain on one side of the body. Denies loss of bowel or bladder control. Denies saddle anesthesia.    Patient Stated Goals Be able to get into and out of the  care more comfortably.    Currently in Pain? Yes    Pain Score 6    R LE   Pain Onset More than a month ago                                     PT Education - 10/01/20 1422    Education Details ther-ex, HEP    Person(s) Educated Patient    Methods Explanation;Demonstration;Tactile cues;Verbal cues;Handout    Comprehension Verbalized understanding;Returned demonstration          Objective   No latex band allergies  MedbridgeAccess Code [email or web address]  Most recent C-section 2007  Medicaid Healthy Blue approval 4 visits from 08/06/2020 to 09/04/2020   Therapeutic exercise   Car transfer  Sitting with lumbar towel roll in car. Feels better per pt. Given as part of HEP  Hip at 90/90   IR WFL bilaterally  Long sit test suggests anterior nutation of R innominate  Seated R hip extension isometrics 10x5 seconds for 3 sets  Decreased R LE symptoms to 4/10 afterwards   Standing static mini lunge forward   R 10x2    Improved exercise technique, movement at target  joints, use of target muscles after min to mod verbal, visual, tactile cues.     Response to treatment Pt tolerated session well without aggravation of symptoms.     Clinical impression Pt arrived late and session was adjusted accordingly. Pt demonstrates overall decreased low back pain and improved ability to perform tasks which involve bending over as well as getting into and out of her car, and tolerating the prone position more comfortably based on subjective reports. Still demonstrates difficulty with R LE radiating symptoms. Decreased R L4 symptoms with treatment to promote R glute strength and posterior tilt of pelvis on the R side.  Pt will benefit from continued skilled physical therapy services to continue to decrease pain, improve strength and function.         PT Short Term Goals - 08/18/20 0908      PT SHORT TERM GOAL #1   Title Patient will be  able to perform her HEP consistently to decrease pain, and improve function.    Baseline Pt has started her HEP. (07/24/2020); Has been doing her HEP consistently (08/18/2020)    Time 3    Period Weeks    Status Achieved    Target Date 08/14/20             PT Long Term Goals - 10/01/20 1358      PT LONG TERM GOAL #1   Title Patient will have a decrease in low back pain to 3/10 or less at worst to promote ability to get into and out of her car as well as perform tasks which involve bending over more comfortably.  R LE is about 7/10 currently.    Baseline 7/10 low back pain at most for the past month (07/24/2020); 6/10 at most for the past 7 days (08/18/2020); 5/10 at most for the past 7 days (09/04/2020); 3-4/10 low back pain at most for the past 7 days (10/01/2020).    Time 6    Period Weeks    Status Partially Met    Target Date 10/16/20      PT LONG TERM GOAL #2   Title Patient will have a decrease in  RLE pain to 3/10 or less at worst to promote ability to get into and out of her car as well as perform tasks which involve bending over more comfortably.    Baseline 8/10 R LE pain at most for the past month without pain medication (07/24/2020); 7/10 at most for the past 7 days (08/18/2020), 7/10 at most for the past 7 days (09/04/2020); 6-8/10 at most for the past 7 day.    Time 6    Period Weeks    Status On-going    Target Date 10/16/20      PT LONG TERM GOAL #3   Title Patient will improve bilateral hip extension and abduction strength by at least 1/2 MMT grade to promote ability to perform standing tasks more comfortably for her back and R LE.    Baseline Hip extension 4-/5 bilaterally, hip abduction 3-/5 R, 4-/5 L (07/24/2020); Hip abduction 4/5 R, 4/5 L, hip extension 4-/5 R,4-/5 L (08/18/2020); Hip abduction: 4+/5 R, 4+/5 L; hip extension 4/5 R, 4/5 L  (09/01/2020)    Time 2    Period Weeks    Status Achieved      PT LONG TERM GOAL #4   Title Patient will improve her lumbar FOTO by  at least 10 points as a demonstration of improved function.  Baseline Lumbar FOTO 56 (07/24/2020); 56 (08/18/2020);Lumbar FOTO 52  (09/01/2020), (10/01/2020)    Time 6    Period Weeks    Status On-going    Target Date 10/16/20                 Plan - 10/01/20 1423    Clinical Impression Statement Pt arrived late and session was adjusted accordingly. Pt demonstrates overall decreased low back pain and improved ability to perform tasks which involve bending over as well as getting into and out of her car, and tolerating the prone position more comfortably based on subjective reports. Still demonstrates difficulty with R LE radiating symptoms. Decreased R L4 symptoms with treatment to promote R glute strength and posterior tilt of pelvis on the R side.  Pt will benefit from continued skilled physical therapy services to continue to decrease pain, improve strength and function.    Personal Factors and Comorbidities Fitness    Examination-Activity Limitations Bed Mobility;Bathing;Hygiene/Grooming;Squat;Lift;Bend;Stand;Reach Overhead;Carry;Transfers    Stability/Clinical Decision Making Stable/Uncomplicated    Clinical Decision Making Low    Rehab Potential Fair    PT Frequency 2x / week    PT Duration 6 weeks    PT Treatment/Interventions Therapeutic activities;Therapeutic exercise;Neuromuscular re-education;Patient/family education;Manual techniques;Dry needling;Spinal Manipulations;Giannelli Manipulations;Aquatic Therapy;Electrical Stimulation;Iontophoresis 75m/ml Dexamethasone;Traction;Ultrasound;Gait training;Stair training;Functional mobility training    PT Next Visit Plan trunk, core, hip strengthening, lumbopelvic control, manual techniques, modalities PRN    PT Home Exercise Plan Medbridge Access Code [email or web address]    Consulted and Agree with Plan of Care Patient           Patient will benefit from skilled therapeutic intervention in order to improve the following deficits and impairments:   Pain, Postural dysfunction, Improper body mechanics, Difficulty walking, Decreased strength, Abnormal gait, Decreased activity tolerance  Visit Diagnosis: Acute bilateral low back pain, unspecified whether sciatica present  Radiculopathy, lumbar region  Muscle weakness (generalized)  Difficulty in walking, not elsewhere classified     Problem List Patient Active Problem List   Diagnosis Date Noted  . Abnormal Pap smear 08/21/2020  . Female pelvic pain 08/21/2020  . Bacterial vaginosis 04/13/2012  . Oral yeast infection 04/13/2012  . GERD (gastroesophageal reflux disease) 07/15/2011   MJoneen BoersPT, DPT   10/01/2020, 2:41 PM  [facility] [address] [place] [address]Phone: [phone]  Fax:  [phone] Name: MNadyaJoint MRN: [id number] of Birth: [birth year]

## 2020-10-07 ENCOUNTER — Other Ambulatory Visit: Payer: Self-pay

## 2020-10-07 ENCOUNTER — Encounter: Payer: Self-pay | Admitting: Podiatry

## 2020-10-07 ENCOUNTER — Ambulatory Visit (INDEPENDENT_AMBULATORY_CARE_PROVIDER_SITE_OTHER): Payer: Medicaid Other | Admitting: Podiatry

## 2020-10-07 DIAGNOSIS — B353 Tinea pedis: Secondary | ICD-10-CM | POA: Diagnosis not present

## 2020-10-07 MED ORDER — ITRACONAZOLE 100 MG PO CAPS
ORAL_CAPSULE | ORAL | 0 refills | Status: DC
Start: 2020-10-07 — End: 2021-01-01

## 2020-10-07 MED ORDER — ITRACONAZOLE 100 MG PO CAPS
ORAL_CAPSULE | ORAL | 0 refills | Status: DC
Start: 2020-10-07 — End: 2020-10-07

## 2020-10-07 MED ORDER — CLOTRIMAZOLE-BETAMETHASONE 1-0.05 % EX CREA: 1.0000 "application " | TOPICAL_CREAM | Freq: Two times a day (BID) | CUTANEOUS | 1 refills | Status: DC

## 2020-10-07 NOTE — Progress Notes (Signed)
  Subjective:  Patient ID: Brittany Holland, female    DOB: May 24, 1981,  MRN: [id number]  Chief Complaint  Patient presents with  . Ingrown Toenail    "its doing better, just a little tender"    39 y.o. female presents with the above complaint.  Patient presents with bilateral athlete's foot.  She states that the itraconazole prescription was only sent for 7 days.  She would like to know if she can get it for more.  It seems to be helping.  The Lotrisone cream has also been helping she would like a refill.  She denies any other acute complaints.   Review of Systems: Negative except as noted in the HPI. Denies N/V/F/Ch.  History reviewed. No pertinent past medical history.  Current Outpatient Medications:  .  clotrimazole-betamethasone (LOTRISONE) cream, Apply 1 application topically 2 (two) times daily., Disp: 30 g, Rfl: 0 .  clotrimazole-betamethasone (LOTRISONE) cream, Apply 1 application topically 2 (two) times daily., Disp: 45 g, Rfl: 1 .  cyclobenzaprine (FLEXERIL) 5 MG tablet, Take 1 tablet (5 mg total) by mouth 3 (three) times daily as needed., Disp: 15 tablet, Rfl: 0 .  ibuprofen (ADVIL) 800 MG tablet, Take 800 mg by mouth 3 (three) times daily., Disp: , Rfl:  .  itraconazole (SPORANOX) 100 MG capsule, Take 1 tablet twice daily x 7 days, then hold for 3 weeks, then repeat cycle, Disp: 14 capsule, Rfl: 0 .  itraconazole (SPORANOX) 100 MG capsule, Take 1 tablet twice daily x 7 days, then hold for 3 weeks, then repeat cycle, Disp: 60 capsule, Rfl: 0 .  Multiple Vitamin (MULTI-VITAMIN) tablet, Take 1 tablet by mouth daily., Disp: , Rfl:  .  terbinafine (LAMISIL) 250 MG tablet, Take 1 tablet (250 mg total) by mouth daily., Disp: 30 tablet, Rfl: 0  Social History   Tobacco Use  Smoking Status Never Smoker  Smokeless Tobacco Never Used    No Known Allergies Objective:  There were no vitals filed for this visit. There is no height or weight on file to calculate BMI. Constitutional  Well developed. Well nourished.  Vascular Dorsalis pedis pulses palpable bilaterally. Posterior tibial pulses palpable bilaterally. Capillary refill normal to all digits.  No cyanosis or clubbing noted. Pedal hair growth normal.  Neurologic Normal speech. Oriented to person, place, and time. Epicritic sensation to light touch grossly present bilaterally.  Dermatologic  multiple patches of scaliness with a superficial epidermal lysis with subjective itching noted to bilateral lower extremity left severe than right.  Seems to be improving  No pain on palpation of the entire/total nail on 1st digit of the left  Orthopedic: Normal Hetz ROM without pain or crepitus bilaterally. No visible deformities. No bony tenderness.   Radiographs: None Assessment:   1. Tinea pedis of both feet    Plan:  Patient was evaluated and treated and all questions answered.  Bilateral athlete's foot left greater than right -I explained patient the etiology of athlete's foot and various treatment options were extensively discussed.  She was not able to tolerate the Lamisil well as it gave her stomachaches.  I believe patient may benefit more itraconazole medication orally.  A new prescription for itraconazole was sent for 30-day supply.  Patient states understanding.  She will continue to take that -Lotrisone cream was also dispensed and she will apply twice a day for refill.  Nail contusion/dystrophy hallux, left -Clinically healed.  No pain noted.  No follow-ups on file.   No follow-ups on file.

## 2020-10-09 ENCOUNTER — Telehealth: Payer: Self-pay

## 2020-10-09 NOTE — Telephone Encounter (Signed)
Presented to the office and stated that her sporanox needs prior authorization.

## 2020-10-10 NOTE — Telephone Encounter (Signed)
"  I called yesterday about a prescription that Dr. Allena Katz prescribed me.  Has it been authorized?  I want to go pick it up."  I think Marylene Land, Dr. Eliane Decree nurse, was working on it.  She may be waiting on a response from the insurance company.  She's not available at this time.  I'll transfer you to the nurse's voicemail.  Leave her a message and she'll call you back.    (Do you want to change the prescription to something else?)

## 2020-10-13 ENCOUNTER — Ambulatory Visit: Payer: Medicaid Other

## 2020-10-15 ENCOUNTER — Telehealth: Payer: Self-pay

## 2020-10-15 NOTE — Telephone Encounter (Signed)
Pt stated that she has not heard anything in regards to the approval of her medication. Please advice.

## 2020-10-15 NOTE — Telephone Encounter (Signed)
Attempted to call pt pertaining to her next session and that her insurance approved more visits. No answer. No voicemail available. Unable to contact patient.

## 2020-10-16 ENCOUNTER — Other Ambulatory Visit: Payer: Self-pay

## 2020-10-16 ENCOUNTER — Ambulatory Visit: Payer: Medicaid Other | Attending: Family Medicine

## 2020-10-16 DIAGNOSIS — M5416 Radiculopathy, lumbar region: Secondary | ICD-10-CM | POA: Insufficient documentation

## 2020-10-16 DIAGNOSIS — M545 Low back pain, unspecified: Secondary | ICD-10-CM | POA: Insufficient documentation

## 2020-10-16 DIAGNOSIS — M6281 Muscle weakness (generalized): Secondary | ICD-10-CM | POA: Diagnosis present

## 2020-10-16 DIAGNOSIS — R262 Difficulty in walking, not elsewhere classified: Secondary | ICD-10-CM | POA: Diagnosis present

## 2020-10-16 NOTE — Therapy (Signed)
[facility] [address], [place], [address] Phone: [phone]   Fax:  [phone]  Physical Therapy Treatment  Patient Details  Name: Brittany Holland MRN: [id number] Date of Birth: 1981-11-12 Referring Provider (PT): Gennette Pac, FNP   Encounter Date: 10/16/2020   PT End of Session - 10/16/20 1115    Visit Number 10    Number of Visits 27    Date for PT Re-Evaluation 12/04/20    Authorization Type 1    Authorization Time Period of 8 Medicaid until 11/04/2020    PT Start Time 1116    PT Stop Time 1159    PT Time Calculation (min) 43 min    Activity Tolerance Patient tolerated treatment well    Behavior During Therapy [facility] for tasks assessed/performed           No past medical history on file.  Past Surgical History:  Procedure Laterality Date  . CESAREAN SECTION     x 2    There were no vitals filed for this visit.   Subjective Assessment - 10/16/20 1119    Subjective Has been having pain in her back, more than usual. Most of the time, her pain is in her R LE. Also having pain in her R back too. Was doing well before. R low back has started bothering her more within the last week, does not know what caused it, gradual increase. Has been walking more.  Has been able to do her HEP.    Pertinent History Low back pain with radiating symptoms. Pain along R low back, posterior hip radiating to proximal medial leg (along the L 4 dermatome). Sudden onset in June 2021. Pt just started having sharp pain in her back when reaching up with her R UE in the shower. Does a lot of walking at work. Works 8 hours. Does not really do heavy lifting. 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 Has not had back pain before. Pt was told to have sciatic nerve pain on one side of the body. Denies loss of bowel or bladder control. Denies saddle anesthesia.    Patient  Stated Goals Be able to get into and out of the care more comfortably.    Currently in Pain? Yes    Pain Score 4    R L4 dermatome 6/10 currently)   Pain Onset More than a month ago                                     PT Education - 10/16/20 1128    Education Details ther-ex, HEP    Person(s) Educated Patient    Methods Explanation;Demonstration;Tactile cues;Verbal cues;Handout    Comprehension Returned demonstration;Verbalized understanding          Objective   No latex band allergies  MedbridgeAccess Code [email or web address]  Most recent C-section 2007    Therapeutic exercise  Reviewed progress/current status with PT towards goals   Standing L lateral shift correction 10x3 with 5 second holds   Reviewed and given as part of HEP. Pt demonstrated and verbalized understanding. Handout provided.   Seated manually resisted R lateral shift in neutral to correct posture 10x3 with 5 second holds   Seated R hip extension isometrics 10x5 seconds for 3 sets  R low back and R LE feels  better after aforementined exercises   Seated manually resisted R upper trunk rotation to promote L lower trunk rotation and more neutral posture 10x3 with 5 second holds   Standing hip flexor stretch             R 30 seconds x 3             L 30 seconds x 3     Response to treatment 3/10 low back and 5/10 R LE after session   Clinical impression Pt was initially doing well with decreasing R low back and R LE pain. Pt however reports having a set back last week with increased symptoms. Worked on decreasing lateral shift posture, improving glute strength and hip extension ROM today. Decreased low back and R LE pain reported after session. Pt will benefit from continued skilled physical therapy services to decrease pain, improve posture, strength, and function.         PT Short Term Goals - 08/18/20 0908      PT SHORT TERM GOAL #1   Title  Patient will be able to perform her HEP consistently to decrease pain, and improve function.    Baseline Pt has started her HEP. (07/24/2020); Has been doing her HEP consistently (08/18/2020)    Time 3    Period Weeks    Status Achieved    Target Date 08/14/20             PT Long Term Goals - 10/16/20 1123      PT LONG TERM GOAL #1   Title Patient will have a decrease in low back pain to 3/10 or less at worst to promote ability to get into and out of her car as well as perform tasks which involve bending over more comfortably.  R LE is about 7/10 currently.    Baseline 7/10 low back pain at most for the past month (07/24/2020); 6/10 at most for the past 7 days (08/18/2020); 5/10 at most for the past 7 days (09/04/2020); 3-4/10 low back pain at most for the past 7 days (10/01/2020). 6/10 R low back pain at most for the past 7 days (10/16/2020)    Time 7    Period Weeks    Status Partially Met    Target Date 12/04/20      PT LONG TERM GOAL #2   Title Patient will have a decrease in  RLE pain to 3/10 or less at worst to promote ability to get into and out of her car as well as perform tasks which involve bending over more comfortably.    Baseline 8/10 R LE pain at most for the past month without pain medication (07/24/2020); 7/10 at most for the past 7 days (08/18/2020), 7/10 at most for the past 7 days (09/04/2020); 6-8/10 at most for the past 7 day. 8/10 at most for the past 7 days (10/16/2020)    Time 7    Period Weeks    Status On-going    Target Date 12/04/20      PT LONG TERM GOAL #3   Title Patient will improve bilateral hip extension and abduction strength by at least 1/2 MMT grade to promote ability to perform standing tasks more comfortably for her back and R LE.    Baseline Hip extension 4-/5 bilaterally, hip abduction 3-/5 R, 4-/5 L (07/24/2020); Hip abduction 4/5 R, 4/5 L, hip extension 4-/5 R,4-/5 L (08/18/2020); Hip abduction: 4+/5 R, 4+/5 L; hip extension 4/5 R, 4/5 L  (  09/01/2020)     Time 2    Period Weeks    Status Achieved      PT LONG TERM GOAL #4   Title Patient will improve her lumbar FOTO by at least 10 points as a demonstration of improved function.    Baseline Lumbar FOTO 56 (07/24/2020); 56 (08/18/2020);Lumbar FOTO 52  (09/01/2020), (10/01/2020)    Time 7    Period Weeks    Status On-going    Target Date 12/04/20                 Plan - 10/16/20 1154    Clinical Impression Statement Pt was initially doing well with decreasing R low back and R LE pain. Pt however reports having a set back last week with increased symptoms. Worked on decreasing lateral shift posture, improving glute strength and hip extension ROM today. Decreased low back and R LE pain reported after session. Pt will benefit from continued skilled physical therapy services to decrease pain, improve posture, strength, and function.    Personal Factors and Comorbidities Fitness    Examination-Activity Limitations Bed Mobility;Bathing;Hygiene/Grooming;Squat;Lift;Bend;Stand;Reach Overhead;Carry;Transfers    Stability/Clinical Decision Making Stable/Uncomplicated    Clinical Decision Making Low    Rehab Potential Fair    PT Frequency 2x / week    PT Duration Other (comment)   7 weeks   PT Treatment/Interventions Therapeutic activities;Therapeutic exercise;Neuromuscular re-education;Patient/family education;Manual techniques;Dry needling;Spinal Manipulations;[place] Manipulations;Aquatic Therapy;Electrical Stimulation;Iontophoresis 26m/ml Dexamethasone;Traction;Ultrasound;Gait training;Stair training;Functional mobility training    PT Next Visit Plan trunk, core, hip strengthening, lumbopelvic control, manual techniques, modalities PRN    PT Home Exercise Plan Medbridge Access Code [email or web address]    Consulted and Agree with Plan of Care Patient           Patient will benefit from skilled therapeutic intervention in order to improve the following deficits and impairments:  Pain, Postural  dysfunction, Improper body mechanics, Difficulty walking, Decreased strength, Abnormal gait, Decreased activity tolerance  Visit Diagnosis: Acute bilateral low back pain, unspecified whether sciatica present - Plan: PT plan of care cert/re-cert  Radiculopathy, lumbar region - Plan: PT plan of care cert/re-cert  Muscle weakness (generalized) - Plan: PT plan of care cert/re-cert  Difficulty in walking, not elsewhere classified - Plan: PT plan of care cert/re-cert     Problem List Patient Active Problem List   Diagnosis Date Noted  . Abnormal Pap smear 08/21/2020  . Female pelvic pain 08/21/2020  . Bacterial vaginosis 04/13/2012  . Oral yeast infection 04/13/2012  . GERD (gastroesophageal reflux disease) 07/15/2011    MJoneen BoersPT, DPT   10/16/2020, 12:24 PM  [facility] [address] [place] [address]Phone: [phone]  Fax:  [phone] Name: MAmarionnaJoint MRN: [id number] of Birth: [birth year]

## 2020-10-16 NOTE — Patient Instructions (Signed)
Standing L lateral shift correction 10x3 with 5 second holds   Reviewed and given as part of HEP. Pt demonstrated and verbalized understanding. Handout provided.

## 2020-10-21 ENCOUNTER — Other Ambulatory Visit: Payer: Self-pay

## 2020-10-21 ENCOUNTER — Emergency Department
Admission: EM | Admit: 2020-10-21 | Discharge: 2020-10-21 | Disposition: A | Discharge status: Home / Self Care | Payer: Medicaid Other | Attending: Emergency Medicine | Admitting: Emergency Medicine

## 2020-10-21 DIAGNOSIS — I10 Essential (primary) hypertension: Secondary | ICD-10-CM | POA: Diagnosis not present

## 2020-10-21 DIAGNOSIS — H53149 Visual discomfort, unspecified: Secondary | ICD-10-CM | POA: Insufficient documentation

## 2020-10-21 DIAGNOSIS — G43009 Migraine without aura, not intractable, without status migrainosus: Secondary | ICD-10-CM

## 2020-10-21 DIAGNOSIS — R519 Headache, unspecified: Secondary | ICD-10-CM | POA: Insufficient documentation

## 2020-10-21 HISTORY — DX: Essential (primary) hypertension: I10

## 2020-10-21 LAB — BASIC METABOLIC PANEL
Anion gap: 8 (ref 5–15)
BUN: 20 mg/dL (ref 6–20)
CO2: 26 mmol/L (ref 22–32)
Calcium: 8.9 mg/dL (ref 8.9–10.3)
Chloride: 104 mmol/L (ref 98–111)
Creatinine, Ser: 0.88 mg/dL (ref 0.44–1.00)
GFR, Estimated: >60 mL/min (ref >60)
Glucose, Bld: 120 mg/dL — ABNORMAL HIGH (ref 70–99)
Potassium: 3.6 mmol/L (ref 3.5–5.1)
Sodium: 138 mmol/L (ref 135–145)

## 2020-10-21 LAB — CBC
HCT: 38 % (ref 36.0–46.0)
Hemoglobin: 12.7 g/dL (ref 12.0–15.0)
MCH: 30.8 pg (ref 26.0–34.0)
MCHC: 33.4 g/dL (ref 30.0–36.0)
MCV: 92.2 fL (ref 80.0–100.0)
Platelets: 216 10*3/uL (ref 150–400)
RBC: 4.12 MIL/uL (ref 3.87–5.11)
RDW: 13.7 % (ref 11.5–15.5)
WBC: 5.9 10*3/uL (ref 4.0–10.5)
nRBC: 0 % (ref 0.0–0.2)

## 2020-10-21 MED ORDER — BUTALBITAL-APAP-CAFFEINE 50-325-40 MG PO TABS: 1.0000 | ORAL_TABLET | Freq: Four times a day (QID) | ORAL | 0 refills | Status: AC | PRN

## 2020-10-21 MED ORDER — KETOROLAC TROMETHAMINE 30 MG/ML IJ SOLN
30.0000 mg | Freq: Once | INTRAMUSCULAR | Status: AC
  Administered 2020-10-21: 30 mg via INTRAVENOUS
  Filled 2020-10-21: qty 1

## 2020-10-21 MED ORDER — SODIUM CHLORIDE 0.9 % IV BOLUS
1000.0000 mL | Freq: Once | INTRAVENOUS | Status: AC
  Administered 2020-10-21: 1000 mL via INTRAVENOUS

## 2020-10-21 MED ORDER — DIPHENHYDRAMINE HCL 50 MG/ML IJ SOLN
50.0000 mg | Freq: Once | INTRAMUSCULAR | Status: AC
  Administered 2020-10-21: 50 mg via INTRAVENOUS
  Filled 2020-10-21: qty 1

## 2020-10-21 MED ORDER — METOCLOPRAMIDE HCL 5 MG/ML IJ SOLN
10.0000 mg | Freq: Once | INTRAMUSCULAR | Status: AC
  Administered 2020-10-21: 10 mg via INTRAVENOUS
  Filled 2020-10-21: qty 2

## 2020-10-21 NOTE — ED Provider Notes (Signed)
[facility] Emergency Department Provider Note  Time seen: 4:17 PM  I have reviewed the triage vital signs and the nursing notes.   HISTORY  Chief Complaint Headache and Dizziness   HPI Brittany Holland is a 39 y.o. female with a past medical history of hypertension, migraines, presents to the emergency department for headache.  According to the patient she used to get significant headaches approximately once per month but over this past year she has not been getting them until recently.  States she awoke with a headache this morning and currently rates it as a 10/10.  States it progressively got worse throughout the day.  Denies any fever.  States some nausea today.  Denies any weakness or numbness.  No cough congestion or shortness of breath.   Past Medical History:  Diagnosis Date  . Hypertension     Patient Active Problem List   Diagnosis Date Noted  . Abnormal Pap smear 08/21/2020  . Female pelvic pain 08/21/2020  . Bacterial vaginosis 04/13/2012  . Oral yeast infection 04/13/2012  . GERD (gastroesophageal reflux disease) 07/15/2011    Past Surgical History:  Procedure Laterality Date  . CESAREAN SECTION     x 2    Prior to Admission medications   Medication Sig Start Date End Date Taking? Authorizing Provider  clotrimazole-betamethasone (LOTRISONE) cream Apply 1 application topically 2 (two) times daily. 08/26/20   Candelaria Stagers, DPM  clotrimazole-betamethasone (LOTRISONE) cream Apply 1 application topically 2 (two) times daily. 10/07/20   Candelaria Stagers, DPM  cyclobenzaprine (FLEXERIL) 5 MG tablet Take 1 tablet (5 mg total) by mouth 3 (three) times daily as needed. 05/23/20   Menshew, Charlesetta Ivory, PA-C  ibuprofen (ADVIL) 800 MG tablet Take 800 mg by mouth 3 (three) times daily. 06/02/20   [provider]  itraconazole (SPORANOX) 100 MG capsule Take 1 tablet twice daily x 7 days, then hold for 3 weeks, then repeat cycle 08/26/20   Candelaria Stagers, DPM  itraconazole (SPORANOX) 100 MG capsule Take 1 tablet twice daily x 7 days, then hold for 3 weeks, then repeat cycle 10/07/20   Candelaria Stagers, DPM  Multiple Vitamin (MULTI-VITAMIN) tablet Take 1 tablet by mouth daily.    [provider]  terbinafine (LAMISIL) 250 MG tablet Take 1 tablet (250 mg total) by mouth daily. 08/21/20   Candelaria Stagers, DPM    No Known Allergies  No family history on file.  Social History Social History   Tobacco Use  . Smoking status: Never Smoker  . Smokeless tobacco: Never Used  Substance Use Topics  . Alcohol use: Not Currently  . Drug use: Not Currently    Review of Systems Constitutional: Negative for fever. Cardiovascular: Negative for chest pain. Respiratory: Negative for shortness of breath. Gastrointestinal: Negative for abdominal pain.  Nausea. Genitourinary: Negative for urinary compaints Musculoskeletal: Negative for musculoskeletal complaints Neurological: Positive for headache.  Positive for photo and phonophobia. All other ROS negative  ____________________________________________   PHYSICAL EXAM:  VITAL SIGNS: ED Triage Vitals  Enc Vitals Group     BP 10/21/20 1407 (!) 188/112     Pulse Rate 10/21/20 1407 80     Resp 10/21/20 1406 16     Temp 10/21/20 1406 98.5 F (36.9 C)     Temp Source 10/21/20 1406 Oral     SpO2 10/21/20 1407 98 %     Weight 10/21/20 1407 230 lb (104.3 kg)     Height  10/21/20 1407 5\' 5"  (1.651 m)     Head Circumference --      Peak Flow --      Pain Score 10/21/20 1405 8     Pain Loc --      Pain Edu? --      Excl. in GC? --    Constitutional: Alert and oriented. Well appearing and in no distress. Eyes: Normal exam, 2 to 3 mm reactive bilaterally. ENT      Head: Normocephalic and atraumatic.      Mouth/Throat: Mucous membranes are moist. Cardiovascular: Normal rate, regular rhythm. No murmur Respiratory: Normal respiratory effort without tachypnea nor retractions. Breath  sounds are clear Gastrointestinal: Soft and nontender. No distention.  Musculoskeletal: Nontender with normal range of motion in all extremities.  Neurologic:  Normal speech and language. No gross focal neurologic deficits are appreciated.  Equal grip strength.  No pronator drift.  Cranial nerves intact. Skin:  Skin is warm, dry and intact.  Psychiatric: Mood and affect are normal.    EKG: EKG viewed and interpreted by myself shows a normal sinus rhythm at 70 bpm with a narrow QRS, normal axis, normal intervals, no concerning ST changes.   ____________________________________________  INITIAL IMPRESSION / ASSESSMENT AND PLAN / ED COURSE  Pertinent labs & imaging results that were available during my care of the patient were reviewed by me and considered in my medical decision making (see chart for details).   Patient presents emergency department significant headache.  Patient states she tried Motrin earlier today without relief.  States a history of significant headaches in the past.  Positive for photo and phonophobia, nausea, which are typical with her prior headaches as well.  Patient is hypertensive, states her PCP has been following her for high blood pressure but is not yet prescribed any medications.  We will treat the patient's headache with Toradol Reglan Benadryl and IV fluids and reassess the patient is well as her blood pressure.  Patient agreeable plan of care.  Basic labs are overall reassuring.  Patient states significant improvement in her headache.  We will discharge patient home with a prescription for Fioricet.  I did discuss with the patient follow-up with her PCP as well as strict ER return precautions.  Brittany Holland was evaluated in Emergency Department on 10/21/2020 for the symptoms described in the history of present illness. She was evaluated in the context of the global COVID-19 pandemic, which necessitated consideration that the patient might be at risk for infection  with the SARS-CoV-2 virus that causes COVID-19. Institutional protocols and algorithms that pertain to the evaluation of patients at risk for COVID-19 are in a state of rapid change based on information released by regulatory bodies including the CDC and federal and state organizations. These policies and algorithms were followed during the patient's care in the ED.  ____________________________________________   FINAL CLINICAL IMPRESSION(S) / ED DIAGNOSES  Headache   10/23/2020, MD 10/21/20 1843

## 2020-10-21 NOTE — Discharge Instructions (Addendum)
As we discussed please take your headache medication as prescribed, as needed.  Please follow-up with your primary care doctor to discuss your elevated blood pressure.  Return to the emergency department for any further significant headache, fever, weakness or numbness of any arm or leg, or any other symptom personally concerning to yourself.

## 2020-10-21 NOTE — ED Triage Notes (Signed)
Pt c/o HA with dizziness since early this morning with N/V.Marland Kitchen pt ambulatory with a steady gait.

## 2020-10-23 ENCOUNTER — Ambulatory Visit: Payer: Medicaid Other

## 2020-10-23 ENCOUNTER — Other Ambulatory Visit: Payer: Self-pay

## 2020-10-23 ENCOUNTER — Telehealth: Payer: Self-pay

## 2020-10-23 DIAGNOSIS — M545 Low back pain, unspecified: Secondary | ICD-10-CM | POA: Diagnosis not present

## 2020-10-23 DIAGNOSIS — M5416 Radiculopathy, lumbar region: Secondary | ICD-10-CM

## 2020-10-23 DIAGNOSIS — R262 Difficulty in walking, not elsewhere classified: Secondary | ICD-10-CM

## 2020-10-23 DIAGNOSIS — M6281 Muscle weakness (generalized): Secondary | ICD-10-CM

## 2020-10-23 NOTE — Telephone Encounter (Signed)
Medication Itraconazole has been approved from 10/23/2020 to 10/23/2021 PA # [id number]

## 2020-10-23 NOTE — Therapy (Signed)
[place] [facility] [address], [place], [address] Phone: [phone]   Fax:  [phone]  Physical Therapy Treatment  Patient Details  Name: Brittany Holland MRN: [id number] Date of Birth: 11-Mar-1981 Referring Provider (PT): Gennette Pac, FNP   Encounter Date: 10/23/2020   PT End of Session - 10/23/20 1436    Visit Number 11    Number of Visits 27    Date for PT Re-Evaluation 12/04/20    Authorization Type 2    Authorization Time Period of 8 Medicaid until 11/04/2020    PT Start Time 1436    PT Stop Time 1505    PT Time Calculation (min) 29 min    Activity Tolerance Patient tolerated treatment well    Behavior During Therapy [facility] for tasks assessed/performed           Past Medical History:  Diagnosis Date  . Hypertension     Past Surgical History:  Procedure Laterality Date  . CESAREAN SECTION     x 2    There were no vitals filed for this visit.   Subjective Assessment - 10/23/20 1437    Subjective Back feels good today. 4/10 R LE pain currently. Just a small bit of back pain. Just depends on her position.    Pertinent History Low back pain with radiating symptoms. Pain along R low back, posterior hip radiating to proximal medial leg (along the L 4 dermatome). Sudden onset in June 2021. Pt just started having sharp pain in her back when reaching up with her R UE in the shower. Does a lot of walking at work. Works 8 hours. Does not really do heavy lifting. 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 Has not had back pain before. Pt was told to have sciatic nerve pain on one side of the body. Denies loss of bowel or bladder control. Denies saddle anesthesia.    Patient Stated Goals Be able to get into and out of the care more comfortably.    Currently in Pain? Yes    Pain Score 4     Pain Onset More than a month ago                                    Objective   No latex band allergies  MedbridgeAccess Code [email or web address]  Most recent C-section 2007      Blood pressure L arm sitting, mechanically taken, normal cuff: 172/115, HR 76 No headaches or blurred vision  BP L arm sitting at end of session: 181/117, HR 69   Pt was recommended to contact her health care provider to see what can be done to decrease her blood pressure to be at an appropriate level for exercises. Pt verbalized understanding.      Manual therapy Seated STM B lumbar paraspinal muscles to decrease muscle  tension and fascial restrictions   No low back or R LE pain after session     Response to treatment No pain in low back or R LE after session   Clinical impression No therapeutic exercises performed today secondary to blood pressure levels not appropriate for that today. Recommended for pt to ask her health care provided pertaining to controlling it. Pt verbalized understanding. Worked on soft tissue techniques today to decrease bilateral lumbar paraspinal muscle tension as well  as fascial restrictions to decrease stress to her low back. No low back or R LE pain reported after session today. Pt will benefit from continued skilled physical therapy services to decrease pain, improve mobility, strength, and function.    PT Education - 10/23/20 1513    Education Details manual therapy, blood pressure    Person(s) Educated Patient    Methods Explanation    Comprehension Verbalized understanding            PT Short Term Goals - 08/18/20 0908      PT SHORT TERM GOAL #1   Title Patient will be able to perform her HEP consistently to decrease pain, and improve function.    Baseline Pt has started her HEP. (07/24/2020); Has been doing her HEP consistently (08/18/2020)    Time 3    Period Weeks    Status Achieved    Target Date 08/14/20             PT Long Term Goals -  10/16/20 1123      PT LONG TERM GOAL #1   Title Patient will have a decrease in low back pain to 3/10 or less at worst to promote ability to get into and out of her car as well as perform tasks which involve bending over more comfortably.  R LE is about 7/10 currently.    Baseline 7/10 low back pain at most for the past month (07/24/2020); 6/10 at most for the past 7 days (08/18/2020); 5/10 at most for the past 7 days (09/04/2020); 3-4/10 low back pain at most for the past 7 days (10/01/2020). 6/10 R low back pain at most for the past 7 days (10/16/2020)    Time 7    Period Weeks    Status Partially Met    Target Date 12/04/20      PT LONG TERM GOAL #2   Title Patient will have a decrease in  RLE pain to 3/10 or less at worst to promote ability to get into and out of her car as well as perform tasks which involve bending over more comfortably.    Baseline 8/10 R LE pain at most for the past month without pain medication (07/24/2020); 7/10 at most for the past 7 days (08/18/2020), 7/10 at most for the past 7 days (09/04/2020); 6-8/10 at most for the past 7 day. 8/10 at most for the past 7 days (10/16/2020)    Time 7    Period Weeks    Status On-going    Target Date 12/04/20      PT LONG TERM GOAL #3   Title Patient will improve bilateral hip extension and abduction strength by at least 1/2 MMT grade to promote ability to perform standing tasks more comfortably for her back and R LE.    Baseline Hip extension 4-/5 bilaterally, hip abduction 3-/5 R, 4-/5 L (07/24/2020); Hip abduction 4/5 R, 4/5 L, hip extension 4-/5 R,4-/5 L (08/18/2020); Hip abduction: 4+/5 R, 4+/5 L; hip extension 4/5 R, 4/5 L  (09/01/2020)    Time 2    Period Weeks    Status Achieved      PT LONG TERM GOAL #4   Title Patient will improve her lumbar FOTO by at least 10 points as a demonstration of improved function.    Baseline Lumbar FOTO 56 (07/24/2020); 56 (08/18/2020);Lumbar FOTO 52  (09/01/2020), (10/01/2020)    Time 7    Period  Weeks    Status On-going  Target Date 12/04/20                 Plan - 10/23/20 1511    Clinical Impression Statement No therapeutic exercises performed today secondary to blood pressure levels not appropriate for that today. Recommended for pt to ask her health care provided pertaining to controlling it. Pt verbalized understanding. Worked on soft tissue techniques today to decrease bilateral lumbar paraspinal muscle tension as well as fascial restrictions to decrease stress to her low back. No low back or R LE pain reported after session today. Pt will benefit from continued skilled physical therapy services to decrease pain, improve mobility, strength, and function.    Personal Factors and Comorbidities Fitness    Examination-Activity Limitations Bed Mobility;Bathing;Hygiene/Grooming;Squat;Lift;Bend;Stand;Reach Overhead;Carry;Transfers    Stability/Clinical Decision Making Stable/Uncomplicated    Rehab Potential Fair    PT Frequency 2x / week    PT Duration Other (comment)   7 weeks   PT Treatment/Interventions Therapeutic activities;Therapeutic exercise;Neuromuscular re-education;Patient/family education;Manual techniques;Dry needling;Spinal Manipulations;[place] Manipulations;Aquatic Therapy;Electrical Stimulation;Iontophoresis 95m/ml Dexamethasone;Traction;Ultrasound;Gait training;Stair training;Functional mobility training    PT Next Visit Plan trunk, core, hip strengthening, lumbopelvic control, manual techniques, modalities PRN    PT Home Exercise Plan Medbridge Access Code [email or web address]    Consulted and Agree with Plan of Care Patient           Patient will benefit from skilled therapeutic intervention in order to improve the following deficits and impairments:  Pain, Postural dysfunction, Improper body mechanics, Difficulty walking, Decreased strength, Abnormal gait, Decreased activity tolerance  Visit Diagnosis: Acute bilateral low back pain, unspecified whether sciatica  present  Radiculopathy, lumbar region  Muscle weakness (generalized)  Difficulty in walking, not elsewhere classified     Problem List Patient Active Problem List   Diagnosis Date Noted  . Abnormal Pap smear 08/21/2020  . Female pelvic pain 08/21/2020  . Bacterial vaginosis 04/13/2012  . Oral yeast infection 04/13/2012  . GERD (gastroesophageal reflux disease) 07/15/2011   MJoneen BoersPT, DPT   10/23/2020, 3:14 PM  [place] [facility] [address] [place] [address]Phone: [phone]  Fax:  [phone] Name: MMehganJoint MRN: [id number] of Birth: [birth year]

## 2020-10-27 ENCOUNTER — Ambulatory Visit: Payer: Medicaid Other

## 2020-10-29 ENCOUNTER — Ambulatory Visit: Payer: Medicaid Other

## 2020-10-29 ENCOUNTER — Other Ambulatory Visit: Payer: Self-pay

## 2020-10-29 DIAGNOSIS — R262 Difficulty in walking, not elsewhere classified: Secondary | ICD-10-CM

## 2020-10-29 DIAGNOSIS — M545 Low back pain, unspecified: Secondary | ICD-10-CM

## 2020-10-29 DIAGNOSIS — M5416 Radiculopathy, lumbar region: Secondary | ICD-10-CM

## 2020-10-29 DIAGNOSIS — M6281 Muscle weakness (generalized): Secondary | ICD-10-CM

## 2020-10-29 NOTE — Therapy (Signed)
[facility] [address], [place], [address] Phone: [phone]   Fax:  [phone]  Physical Therapy Treatment  Patient Details  Name: Brittany Holland MRN: [id number] Date of Birth: 09/24/1981 Referring Provider (PT): Gennette Pac, FNP   Encounter Date: 10/29/2020   PT End of Session - 10/29/20 1604    Number of Visits 27    Date for PT Re-Evaluation 12/04/20    Authorization Type 3    Authorization Time Period of 8 Medicaid until 11/04/2020    PT Start Time 1604    PT Stop Time 1635    PT Time Calculation (min) 31 min    Activity Tolerance Patient tolerated treatment well    Behavior During Therapy [facility] for tasks assessed/performed           Past Medical History:  Diagnosis Date  . Hypertension     Past Surgical History:  Procedure Laterality Date  . CESAREAN SECTION     x 2    There were no vitals filed for this visit.   Subjective Assessment - 10/29/20 1608    Subjective Back is ok, 2/10 low back pain (in sitting), 5/10 R LE pain currently. The manual therapy last session helped make her back feel better. Novermber 30, 2021, pt seeing a health care provider for her high blood pressure.    Pertinent History Low back pain with radiating symptoms. Pain along R low back, posterior hip radiating to proximal medial leg (along the L 4 dermatome). Sudden onset in June 2021. Pt just started having sharp pain in her back when reaching up with her R UE in the shower. Does a lot of walking at work. Works 8 hours. Does not really do heavy lifting. 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 Has not had back pain before. Pt was told to have sciatic nerve pain on one side of the body. Denies loss of bowel or bladder control. Denies saddle anesthesia.    Patient Stated Goals Be able to get into and out of the care more comfortably.    Currently in  Pain? Yes    Pain Score 5    2/10 LBP, 5/10 R LE pain   Pain Onset More than a month ago                                     PT Education - 10/29/20 1643    Education Details manual therapy    Person(s) Educated Patient    Methods Explanation    Comprehension Verbalized understanding            Objective   No latex band allergies  MedbridgeAccess Code [email or web address]  Most recent C-section 2007      Blood pressure L arm sitting, mechanically taken, normal cuff:   BP L arm sitting at start of session: 175/107, HR 81  BP L arm sitting at end of session: 179/113, HR 67    Manual therapy  Seated STM B lumbar paraspinal muscles to decrease muscle  tension and fascial restrictions              No low back or R LE pain in sitting afterwards    Response to treatment No pain in low back or R LE after session in sitting, centralization of R LE L4  symptoms from knee to R anterior hip after session.    Clinical impression No therapeutic exercises performed today secondary to blood pressure levels not appropriate for that today. Pt seeing a healthcare provider pertaining to her BP next week. Continued manual therapy to decrease muscle tension and stress to her low back secondary to positive response from previous session. Centralized R LE symptoms for her R knee to R anterior hip and no low back pain after session. Pt will benefit from continued skilled physical therapy services to decrease pain, improve strength and function.       PT Short Term Goals - 08/18/20 0908      PT SHORT TERM GOAL #1   Title Patient will be able to perform her HEP consistently to decrease pain, and improve function.    Baseline Pt has started her HEP. (07/24/2020); Has been doing her HEP consistently (08/18/2020)    Time 3    Period Weeks    Status Achieved    Target Date 08/14/20             PT Long Term Goals - 10/16/20 1123      PT  LONG TERM GOAL #1   Title Patient will have a decrease in low back pain to 3/10 or less at worst to promote ability to get into and out of her car as well as perform tasks which involve bending over more comfortably.  R LE is about 7/10 currently.    Baseline 7/10 low back pain at most for the past month (07/24/2020); 6/10 at most for the past 7 days (08/18/2020); 5/10 at most for the past 7 days (09/04/2020); 3-4/10 low back pain at most for the past 7 days (10/01/2020). 6/10 R low back pain at most for the past 7 days (10/16/2020)    Time 7    Period Weeks    Status Partially Met    Target Date 12/04/20      PT LONG TERM GOAL #2   Title Patient will have a decrease in  RLE pain to 3/10 or less at worst to promote ability to get into and out of her car as well as perform tasks which involve bending over more comfortably.    Baseline 8/10 R LE pain at most for the past month without pain medication (07/24/2020); 7/10 at most for the past 7 days (08/18/2020), 7/10 at most for the past 7 days (09/04/2020); 6-8/10 at most for the past 7 day. 8/10 at most for the past 7 days (10/16/2020)    Time 7    Period Weeks    Status On-going    Target Date 12/04/20      PT LONG TERM GOAL #3   Title Patient will improve bilateral hip extension and abduction strength by at least 1/2 MMT grade to promote ability to perform standing tasks more comfortably for her back and R LE.    Baseline Hip extension 4-/5 bilaterally, hip abduction 3-/5 R, 4-/5 L (07/24/2020); Hip abduction 4/5 R, 4/5 L, hip extension 4-/5 R,4-/5 L (08/18/2020); Hip abduction: 4+/5 R, 4+/5 L; hip extension 4/5 R, 4/5 L  (09/01/2020)    Time 2    Period Weeks    Status Achieved      PT LONG TERM GOAL #4   Title Patient will improve her lumbar FOTO by at least 10 points as a demonstration of improved function.    Baseline Lumbar FOTO 56 (07/24/2020); 56 (08/18/2020);Lumbar FOTO 52  (09/01/2020), (10/01/2020)  Time 7    Period Weeks    Status  On-going    Target Date 12/04/20                 Plan - 10/29/20 1606    Clinical Impression Statement No therapeutic exercises performed today secondary to blood pressure levels not appropriate for that today. Pt seeing a healthcare provider pertaining to her BP next week. Continued manual therapy to decrease muscle tension and stress to her low back secondary to positive response from previous session. Centralized R LE symptoms for her R knee to R anterior hip and no low back pain after session. Pt will benefit from continued skilled physical therapy services to decrease pain, improve strength and function.    Personal Factors and Comorbidities Fitness    Examination-Activity Limitations Bed Mobility;Bathing;Hygiene/Grooming;Squat;Lift;Bend;Stand;Reach Overhead;Carry;Transfers    Stability/Clinical Decision Making Stable/Uncomplicated    Rehab Potential Fair    PT Frequency 2x / week    PT Duration Other (comment)   7 weeks   PT Treatment/Interventions Therapeutic activities;Therapeutic exercise;Neuromuscular re-education;Patient/family education;Manual techniques;Dry needling;Spinal Manipulations;Allmendinger Manipulations;Aquatic Therapy;Electrical Stimulation;Iontophoresis 72m/ml Dexamethasone;Traction;Ultrasound;Gait training;Stair training;Functional mobility training    PT Next Visit Plan trunk, core, hip strengthening, lumbopelvic control, manual techniques, modalities PRN    PT Home Exercise Plan Medbridge Access Code [email or web address]    Consulted and Agree with Plan of Care Patient           Patient will benefit from skilled therapeutic intervention in order to improve the following deficits and impairments:  Pain, Postural dysfunction, Improper body mechanics, Difficulty walking, Decreased strength, Abnormal gait, Decreased activity tolerance  Visit Diagnosis: Acute bilateral low back pain, unspecified whether sciatica present  Radiculopathy, lumbar region  Muscle weakness  (generalized)  Difficulty in walking, not elsewhere classified     Problem List Patient Active Problem List   Diagnosis Date Noted  . Abnormal Pap smear 08/21/2020  . Female pelvic pain 08/21/2020  . Bacterial vaginosis 04/13/2012  . Oral yeast infection 04/13/2012  . GERD (gastroesophageal reflux disease) 07/15/2011   MJoneen BoersPT, DPT   10/29/2020, 4:50 PM  [facility] [address] [place] [address]Phone: [phone]  Fax:  [phone] Name: Brittany Holland MRN: [id number] of Birth: 3September 19, 1982

## 2020-11-03 ENCOUNTER — Ambulatory Visit: Payer: Medicaid Other

## 2020-11-04 ENCOUNTER — Other Ambulatory Visit: Payer: Self-pay

## 2020-11-04 ENCOUNTER — Ambulatory Visit: Payer: Medicaid Other

## 2020-11-04 DIAGNOSIS — M545 Low back pain, unspecified: Secondary | ICD-10-CM

## 2020-11-04 DIAGNOSIS — M6281 Muscle weakness (generalized): Secondary | ICD-10-CM

## 2020-11-04 DIAGNOSIS — R262 Difficulty in walking, not elsewhere classified: Secondary | ICD-10-CM

## 2020-11-04 DIAGNOSIS — M5416 Radiculopathy, lumbar region: Secondary | ICD-10-CM

## 2020-11-04 NOTE — Patient Instructions (Signed)
Sit on your right hip a little bit   And gently lean back against a towel roll

## 2020-11-04 NOTE — Therapy (Signed)
[facility] [address] [place], [address] Phone: [phone]   Fax:  [phone]  Physical Therapy Treatment  Patient Details  Name: Brittany Holland MRN: [id number] Date of Birth: 1980-12-21 Referring Provider (PT): Gennette Pac, FNP   Encounter Date: 11/04/2020   PT End of Session - 11/04/20 0847    Visit Number 13    Number of Visits 27    Date for PT Re-Evaluation 12/04/20    Authorization Type 4    Authorization Time Period of 8 Medicaid until 11/04/2020    PT Start Time 0849    PT Stop Time 0930    PT Time Calculation (min) 41 min    Activity Tolerance Patient tolerated treatment well    Behavior During Therapy [facility] for tasks assessed/performed           Past Medical History:  Diagnosis Date  . Hypertension     Past Surgical History:  Procedure Laterality Date  . CESAREAN SECTION     x 2    There were no vitals filed for this visit.   Subjective Assessment - 11/04/20 0850    Subjective Back feels good. No pain currently. 4/10 low back pain at most for the past 7 days. 4/10 R LE symptoms (to proximal leg), 6/10 at most for the past 7 days.    Pertinent History Low back pain with radiating symptoms. Pain along R low back, posterior hip radiating to proximal medial leg (along the L 4 dermatome). Sudden onset in June 2021. Pt just started having sharp pain in her back when reaching up with her R UE in the shower. Does a lot of walking at work. Works 8 hours. Does not really do heavy lifting. 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 Has not had back pain before. Pt was told to have sciatic nerve pain on one side of the body. Denies loss of bowel or bladder control. Denies saddle anesthesia.    Patient Stated Goals Be able to get into and out of the care more comfortably.    Currently in Pain? Yes    Pain Score 4    R LE radiating  symptoms.   Pain Onset More than a month ago                                     PT Education - 11/04/20 0929    Education Details Sitting position    Person(s) Educated Patient    Methods Explanation;Demonstration;Tactile cues;Verbal cues    Comprehension Verbalized understanding;Returned demonstration          Objective   No latex band allergies  MedbridgeAccess Code [email or web address]  Most recent C-section 2007    Therapeutic activity  Blood pressure L arm sitting, mechanically taken, normal cuff:   BP L arm sitting at start of session: 153/103, HR 75  Reviewed progress with pain levels for back and R LE  Sitting with R weight shifting to decrease L lateral shift  Decreased R LE symptoms.     Then leaning back against towel roll     Decreased R LE symptoms.     Improved technique, movement at target joints, use of target muscles after mod verbal, visual, tactile cues.     Manual therapy  Seated STM B lumbar paraspinal muscles to  decrease muscle tension and fascial restrictions  No Low back pain and decreased R LE symptoms in sitting afterwards.     Response to treatment Decreased R LE symptoms after session.    Clinical impression Pt demonstrates overall decreasing low back and R LE symptoms since initial evaluation. Decreased low back pain with treatment to promote proper posture, core, glute strength, as well as decreasing lumbar paraspinal muscle tension. Pt demonstrates elevation of blood pressure levels which limited participation in exercises in which pt is scheduled to see a health care provider to address elevated blood pressure levels. Overall, pt seems to be progressing with PT towards goals. Pt will benefit from continued skilled physical therapy services to decrease pain, improve strength and function.       PT Short Term Goals - 08/18/20 0908      PT SHORT TERM GOAL #1   Title Patient  will be able to perform her HEP consistently to decrease pain, and improve function.    Baseline Pt has started her HEP. (07/24/2020); Has been doing her HEP consistently (08/18/2020)    Time 3    Period Weeks    Status Achieved    Target Date 08/14/20             PT Long Term Goals - 11/04/20 0853      PT LONG TERM GOAL #1   Title Patient will have a decrease in low back pain to 3/10 or less at worst to promote ability to get into and out of her car as well as perform tasks which involve bending over more comfortably.  R LE is about 7/10 currently.    Baseline 7/10 low back pain at most for the past month (07/24/2020); 6/10 at most for the past 7 days (08/18/2020); 5/10 at most for the past 7 days (09/04/2020); 3-4/10 low back pain at most for the past 7 days (10/01/2020). 6/10 R low back pain at most for the past 7 days (10/16/2020); 4/10 at most for the past 7 days (11/04/2020)    Time 7    Period Weeks    Status Partially Met    Target Date 12/04/20      PT LONG TERM GOAL #2   Title Patient will have a decrease in  RLE pain to 3/10 or less at worst to promote ability to get into and out of her car as well as perform tasks which involve bending over more comfortably.    Baseline 8/10 R LE pain at most for the past month without pain medication (07/24/2020); 7/10 at most for the past 7 days (08/18/2020), 7/10 at most for the past 7 days (09/04/2020); 6-8/10 at most for the past 7 day. 8/10 at most for the past 7 days (10/16/2020); 6/10 at most for the past 7 days (11/04/2020)    Time 7    Period Weeks    Status On-going    Target Date 12/04/20      PT LONG TERM GOAL #3   Title Patient will improve bilateral hip extension and abduction strength by at least 1/2 MMT grade to promote ability to perform standing tasks more comfortably for her back and R LE.    Baseline Hip extension 4-/5 bilaterally, hip abduction 3-/5 R, 4-/5 L (07/24/2020); Hip abduction 4/5 R, 4/5 L, hip extension 4-/5 R,4-/5  L (08/18/2020); Hip abduction: 4+/5 R, 4+/5 L; hip extension 4/5 R, 4/5 L  (09/01/2020)    Time 2    Period Weeks  Status Achieved      PT LONG TERM GOAL #4   Title Patient will improve her lumbar FOTO by at least 10 points as a demonstration of improved function.    Baseline Lumbar FOTO 56 (07/24/2020); 56 (08/18/2020);Lumbar FOTO 52  (09/01/2020), (10/01/2020)    Time 7    Period Weeks    Status On-going    Target Date 12/04/20                 Plan - 11/04/20 1153    Clinical Impression Statement Pt demonstrates overall decreasing low back and R LE symptoms since initial evaluation. Decreased low back pain with treatment to promote proper posture, core, glute strength, as well as decreasing lumbar paraspinal muscle tension. Pt demonstrates elevation of blood pressure levels which limited participation in exercises in which pt is scheduled to see a health care provider to address elevated blood pressure levels. Overall, pt seems to be progressing with PT towards goals. Pt will benefit from continued skilled physical therapy services to decrease pain, improve strength and function.    Personal Factors and Comorbidities Fitness    Examination-Activity Limitations Bed Mobility;Bathing;Hygiene/Grooming;Squat;Lift;Bend;Stand;Reach Overhead;Carry;Transfers    Stability/Clinical Decision Making Stable/Uncomplicated    Rehab Potential Fair    PT Frequency 2x / week    PT Duration Other (comment)   7 weeks   PT Treatment/Interventions Therapeutic activities;Therapeutic exercise;Neuromuscular re-education;Patient/family education;Manual techniques;Dry needling;Spinal Manipulations;Aburto Manipulations;Aquatic Therapy;Electrical Stimulation;Iontophoresis 17m/ml Dexamethasone;Traction;Ultrasound;Gait training;Stair training;Functional mobility training    PT Next Visit Plan trunk, core, hip strengthening, lumbopelvic control, manual techniques, modalities PRN    PT Home Exercise Plan Medbridge  Access Code [email or web address]    Consulted and Agree with Plan of Care Patient           Patient will benefit from skilled therapeutic intervention in order to improve the following deficits and impairments:  Pain, Postural dysfunction, Improper body mechanics, Difficulty walking, Decreased strength, Abnormal gait, Decreased activity tolerance  Visit Diagnosis: Acute bilateral low back pain, unspecified whether sciatica present  Radiculopathy, lumbar region  Muscle weakness (generalized)  Difficulty in walking, not elsewhere classified     Problem List Patient Active Problem List   Diagnosis Date Noted  . Abnormal Pap smear 08/21/2020  . Female pelvic pain 08/21/2020  . Bacterial vaginosis 04/13/2012  . Oral yeast infection 04/13/2012  . GERD (gastroesophageal reflux disease) 07/15/2011    MJoneen BoersPT, DPT   11/04/2020, 12:05 PM  [facility] [address] [place] [address]Phone: [phone]  Fax:  [phone] Name: MLizmaryJoint MRN: [id number] of Birth: 309/19/1982

## 2020-11-05 ENCOUNTER — Ambulatory Visit: Payer: Medicaid Other

## 2020-11-11 ENCOUNTER — Other Ambulatory Visit: Payer: Self-pay

## 2020-11-11 ENCOUNTER — Ambulatory Visit: Payer: Medicaid Other | Attending: Family Medicine

## 2020-11-11 DIAGNOSIS — R262 Difficulty in walking, not elsewhere classified: Secondary | ICD-10-CM | POA: Diagnosis present

## 2020-11-11 DIAGNOSIS — M6281 Muscle weakness (generalized): Secondary | ICD-10-CM | POA: Insufficient documentation

## 2020-11-11 DIAGNOSIS — M545 Low back pain, unspecified: Secondary | ICD-10-CM | POA: Insufficient documentation

## 2020-11-11 DIAGNOSIS — M5416 Radiculopathy, lumbar region: Secondary | ICD-10-CM | POA: Insufficient documentation

## 2020-11-11 NOTE — Therapy (Signed)
[facility] [address] [place], [place], [address] Phone: [phone]   Fax:  [phone]  Physical Therapy Treatment  Patient Details  Name: Brittany Holland MRN: [id number] Date of Birth: 11/24/1981 Referring Provider (PT): Emily Headrick, FNP   Encounter Date: 11/11/2020   PT End of Session - 11/11/20 1351    Visit Number 14    Number of Visits 27    Date for PT Re-Evaluation 12/04/20    Authorization Type 5    Authorization Time Period of 8 Medicaid until    PT Start Time 1350    PT Stop Time 1430    PT Time Calculation (min) 40 min    Activity Tolerance Patient tolerated treatment well    Behavior During Therapy WFL for tasks assessed/performed           Past Medical History:  Diagnosis Date  . Hypertension     Past Surgical History:  Procedure Laterality Date  . CESAREAN SECTION     x 2    There were no vitals filed for this visit.   Subjective Assessment - 11/11/20 1353    Subjective No back pain currently. 5/10 R LE pain in sitting. Got losartan BP medicine.    Pertinent History Low back pain with radiating symptoms. Pain along R low back, posterior hip radiating to proximal medial leg (along the L 4 dermatome). Sudden onset in June 2021. Pt just started having sharp pain in her back when reaching up with her R UE in the shower. Does a lot of walking at work. Works 8 hours. Does not really do heavy lifting. 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 Has not had back pain before. Pt was told to have sciatic nerve pain on one side of the body. Denies loss of bowel or bladder control. Denies saddle anesthesia.    Patient Stated Goals Be able to get into and out of the care more comfortably.    Currently in Pain? Yes    Pain Score 5     Pain Onset More than a month ago                                     PT  Education - 11/11/20 1426    Education Details ther-ex    Person(s) Educated Patient    Methods Explanation    Comprehension Verbalized understanding;Returned demonstration              Objective   No latex band allergies  MedbridgeAccess Code [email or web address]  Most recent C-section 2007    Therapeutic exercise  Sitting with lumbar towel roll x 2 minutes. No change in R LE symptoms.   Blood pressure L arm sitting, mechanically taken, normal cuff: 162/100  Sitting, leaning back on the chair with L weight shift position to decrease R lateral shift. Decreased R LE pain,   Then with lumbar towel roll     Pt education on L4 dermatome and R lateral shift posture  BP L arm sitting: 152/97, HR 69  Seated trunk extension isometrics, manually resited 10x5 seconds in neutral for 3 sets  Seated manually resisted L lateral shift isometrics to promote more neutral posture 10x3 with 5 second holds   Seated manually resisted L upper trunk rotation to promote R lumbar rotation and more   neutral posture for low back 10x2 with 5 second holds   Decreased R LE pain.   Seated manually resisted trunk flexion isometrics in neutral 10x 5 seconds    BP L arm sitting: 174/105, HR 73 at end of session     Improved technique, movement at target joints, use of target muscles after mod verbal, visual, tactile cues.     Manual therapy Seated STM B lumbar paraspinal muscles to decrease muscle tension and fascial restrictions  Decreased R LE symptoms in sitting afterwards.     Response to treatment Decreased R LE symptoms after session to 4/10   Clinical impression Decreased R LE symptoms with decreasing R lateral shift, promoting trunk extension as well as promoting neutral rotation to low back. Pt will benefit from continued skilled physical therapy services to decrease pain, improve strength and function.       PT Short Term Goals - 08/18/20 0908       PT SHORT TERM GOAL #1   Title Patient will be able to perform her HEP consistently to decrease pain, and improve function.    Baseline Pt has started her HEP. (07/24/2020); Has been doing her HEP consistently (08/18/2020)    Time 3    Period Weeks    Status Achieved    Target Date 08/14/20             PT Long Term Goals - 11/04/20 0853      PT LONG TERM GOAL #1   Title Patient will have a decrease in low back pain to 3/10 or less at worst to promote ability to get into and out of her car as well as perform tasks which involve bending over more comfortably.  R LE is about 7/10 currently.    Baseline 7/10 low back pain at most for the past month (07/24/2020); 6/10 at most for the past 7 days (08/18/2020); 5/10 at most for the past 7 days (09/04/2020); 3-4/10 low back pain at most for the past 7 days (10/01/2020). 6/10 R low back pain at most for the past 7 days (10/16/2020); 4/10 at most for the past 7 days (11/04/2020)    Time 7    Period Weeks    Status Partially Met    Target Date 12/04/20      PT LONG TERM GOAL #2   Title Patient will have a decrease in  RLE pain to 3/10 or less at worst to promote ability to get into and out of her car as well as perform tasks which involve bending over more comfortably.    Baseline 8/10 R LE pain at most for the past month without pain medication (07/24/2020); 7/10 at most for the past 7 days (08/18/2020), 7/10 at most for the past 7 days (09/04/2020); 6-8/10 at most for the past 7 day. 8/10 at most for the past 7 days (10/16/2020); 6/10 at most for the past 7 days (11/04/2020)    Time 7    Period Weeks    Status On-going    Target Date 12/04/20      PT LONG TERM GOAL #3   Title Patient will improve bilateral hip extension and abduction strength by at least 1/2 MMT grade to promote ability to perform standing tasks more comfortably for her back and R LE.    Baseline Hip extension 4-/5 bilaterally, hip abduction 3-/5 R, 4-/5 L (07/24/2020); Hip  abduction 4/5 R, 4/5 L, hip extension 4-/5 R,4-/5 L (08/18/2020); Hip abduction: 4+/5 R, 4+/5   L; hip extension 4/5 R, 4/5 L  (09/01/2020)    Time 2    Period Weeks    Status Achieved      PT LONG TERM GOAL #4   Title Patient will improve her lumbar FOTO by at least 10 points as a demonstration of improved function.    Baseline Lumbar FOTO 56 (07/24/2020); 56 (08/18/2020);Lumbar FOTO 52  (09/01/2020), (10/01/2020)    Time 7    Period Weeks    Status On-going    Target Date 12/04/20                 Plan - 11/11/20 2007    Clinical Impression Statement Decreased R LE symptoms with decreasing R lateral shift, promoting trunk extension as well as promoting neutral rotation to low back. Pt will benefit from continued skilled physical therapy services to decrease pain, improve strength and function.    Personal Factors and Comorbidities Fitness    Examination-Activity Limitations Bed Mobility;Bathing;Hygiene/Grooming;Squat;Lift;Bend;Stand;Reach Overhead;Carry;Transfers    Stability/Clinical Decision Making Stable/Uncomplicated    Rehab Potential Fair    PT Frequency 2x / week    PT Duration Other (comment)   7 weeks   PT Treatment/Interventions Therapeutic activities;Therapeutic exercise;Neuromuscular re-education;Patient/family education;Manual techniques;Dry needling;Spinal Manipulations;[place] Manipulations;Aquatic Therapy;Electrical Stimulation;Iontophoresis 4mg/ml Dexamethasone;Traction;Ultrasound;Gait training;Stair training;Functional mobility training    PT Next Visit Plan trunk, core, hip strengthening, lumbopelvic control, manual techniques, modalities PRN    PT Home Exercise Plan Medbridge Access Code [email or web address]    Consulted and Agree with Plan of Care Patient           Patient will benefit from skilled therapeutic intervention in order to improve the following deficits and impairments:  Pain, Postural dysfunction, Improper body mechanics, Difficulty walking, Decreased strength,  Abnormal gait, Decreased activity tolerance  Visit Diagnosis: Acute bilateral low back pain, unspecified whether sciatica present  Radiculopathy, lumbar region  Muscle weakness (generalized)  Difficulty in walking, not elsewhere classified     Problem List Patient Active Problem List   Diagnosis Date Noted  . Abnormal Pap smear 08/21/2020  . Female pelvic pain 08/21/2020  . Bacterial vaginosis 04/13/2012  . Oral yeast infection 04/13/2012  . GERD (gastroesophageal reflux disease) 07/15/2011    Miguel Laygo PT, DPT    11/11/2020, 8:10 PM   [facility] [address] [place], [place], [address] Phone: [phone]   Fax:  [phone]  Name: Brittany Holland MRN: [id number] Date of Birth: 10/14/1981   

## 2020-11-18 ENCOUNTER — Other Ambulatory Visit: Payer: Self-pay

## 2020-11-18 ENCOUNTER — Ambulatory Visit: Payer: Medicaid Other

## 2020-11-18 DIAGNOSIS — M5416 Radiculopathy, lumbar region: Secondary | ICD-10-CM

## 2020-11-18 DIAGNOSIS — M545 Low back pain, unspecified: Secondary | ICD-10-CM

## 2020-11-18 DIAGNOSIS — R262 Difficulty in walking, not elsewhere classified: Secondary | ICD-10-CM

## 2020-11-18 DIAGNOSIS — M6281 Muscle weakness (generalized): Secondary | ICD-10-CM

## 2020-11-18 NOTE — Therapy (Signed)
[facility] [address] [place], [address] Phone: [phone]   Fax:  [phone]  Physical Therapy Treatment  Patient Details  Name: Brittany Holland MRN: [id number] Date of Birth: 06-28-1981 Referring Provider (PT): Gennette Pac, FNP   Encounter Date: 11/18/2020   PT End of Session - 11/18/20 1307    Visit Number 15    Number of Visits 27    Date for PT Re-Evaluation 12/04/20    Authorization Type 6    Authorization Time Period of 8 Medicaid until    PT Start Time 1305    PT Stop Time 1334    PT Time Calculation (min) 29 min    Activity Tolerance Patient tolerated treatment well    Behavior During Therapy [facility] for tasks assessed/performed           Past Medical History:  Diagnosis Date  . Hypertension     Past Surgical History:  Procedure Laterality Date  . CESAREAN SECTION     x 2    There were no vitals filed for this visit.   Subjective Assessment - 11/18/20 1309    Subjective 3/10 in sitting, 4/10 when walking for low back.   0/10 R LE pain currently. Has to be out by 1:35 pm due to being on lunch break.    Pertinent History Low back pain with radiating symptoms. Pain along R low back, posterior hip radiating to proximal medial leg (along the L 4 dermatome). Sudden onset in June 2021. Pt just started having sharp pain in her back when reaching up with her R UE in the shower. Does a lot of walking at work. Works 8 hours. Does not really do heavy lifting. 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 Has not had back pain before. Pt was told to have sciatic nerve pain on one side of the body. Denies loss of bowel or bladder control. Denies saddle anesthesia.    Patient Stated Goals Be able to get into and out of the care more comfortably.    Currently in Pain? Yes    Pain Score 4     Pain Onset More than a month ago                                      PT Education - 11/18/20 1343    Education Details positining.    Person(s) Educated Patient    Methods Explanation;Demonstration;Tactile cues;Verbal cues    Comprehension Returned demonstration;Verbalized understanding          Objective   No latex band allergies  MedbridgeAccess Code [email or web address]  Most recent C-section 2007    Therapeutic activities BP L arm sitting, mechanically taken, normal cuff: 179/107, HR75  Sitting with lumbar towel roll to promote gentle extension  Sitting position with PT placing pt in slight L lateral shift to decrease R lateral shift   Then with PT assist to place lumbar in slight R rotation to promote neutral posture   No low back pain afterwards.    Blood pressure L arm sitting, mechanically taken, normal cuff: 177/110, HR 67 after session.   Improved technique, movement at target joints, use of target muscles after min verbal, visual, tactile cues.         Manual therapy Seated STM B lumbar paraspinal muscles  to decrease muscle tension and fascial restrictions  Decreased R LE symptoms in sitting afterwards.    Response to treatment No low back and R LE pain after session.    Clinical impression Pt returns to follow up session without R LE pain for the first time. Continued working on decreasing lumbothoracic paraspinal muscle tension and fascial restrictions as well as promoting gentle extension and neutral lumbar position to help decrease stress to low back and LE nerves. No low back and R LE pain reported after session. Pt will benefit from continued skilled physical therapy services to decrease pain, improve strength and function.         PT Short Term Goals - 08/18/20 0908      PT SHORT TERM GOAL #1   Title Patient will be able to perform her HEP consistently to decrease pain, and improve function.    Baseline Pt has started  her HEP. (07/24/2020); Has been doing her HEP consistently (08/18/2020)    Time 3    Period Weeks    Status Achieved    Target Date 08/14/20             PT Long Term Goals - 11/04/20 0853      PT LONG TERM GOAL #1   Title Patient will have a decrease in low back pain to 3/10 or less at worst to promote ability to get into and out of her car as well as perform tasks which involve bending over more comfortably.  R LE is about 7/10 currently.    Baseline 7/10 low back pain at most for the past month (07/24/2020); 6/10 at most for the past 7 days (08/18/2020); 5/10 at most for the past 7 days (09/04/2020); 3-4/10 low back pain at most for the past 7 days (10/01/2020). 6/10 R low back pain at most for the past 7 days (10/16/2020); 4/10 at most for the past 7 days (11/04/2020)    Time 7    Period Weeks    Status Partially Met    Target Date 12/04/20      PT LONG TERM GOAL #2   Title Patient will have a decrease in  RLE pain to 3/10 or less at worst to promote ability to get into and out of her car as well as perform tasks which involve bending over more comfortably.    Baseline 8/10 R LE pain at most for the past month without pain medication (07/24/2020); 7/10 at most for the past 7 days (08/18/2020), 7/10 at most for the past 7 days (09/04/2020); 6-8/10 at most for the past 7 day. 8/10 at most for the past 7 days (10/16/2020); 6/10 at most for the past 7 days (11/04/2020)    Time 7    Period Weeks    Status On-going    Target Date 12/04/20      PT LONG TERM GOAL #3   Title Patient will improve bilateral hip extension and abduction strength by at least 1/2 MMT grade to promote ability to perform standing tasks more comfortably for her back and R LE.    Baseline Hip extension 4-/5 bilaterally, hip abduction 3-/5 R, 4-/5 L (07/24/2020); Hip abduction 4/5 R, 4/5 L, hip extension 4-/5 R,4-/5 L (08/18/2020); Hip abduction: 4+/5 R, 4+/5 L; hip extension 4/5 R, 4/5 L  (09/01/2020)    Time 2    Period Weeks     Status Achieved      PT LONG TERM GOAL #4   Title Patient  will improve her lumbar FOTO by at least 10 points as a demonstration of improved function.    Baseline Lumbar FOTO 56 (07/24/2020); 56 (08/18/2020);Lumbar FOTO 52  (09/01/2020), (10/01/2020)    Time 7    Period Weeks    Status On-going    Target Date 12/04/20                 Plan - 11/18/20 1340    Clinical Impression Statement Pt returns to follow up session without R LE pain for the first time. Continued working on decreasing lumbothoracic paraspinal muscle tension and fascial restrictions as well as promoting gentle extension and neutral lumbar position to help decrease stress to low back and LE nerves. No low back and R LE pain reported after session. Pt will benefit from continued skilled physical therapy services to decrease pain, improve strength and function.    Personal Factors and Comorbidities Fitness    Examination-Activity Limitations Bed Mobility;Bathing;Hygiene/Grooming;Squat;Lift;Bend;Stand;Reach Overhead;Carry;Transfers    Stability/Clinical Decision Making Stable/Uncomplicated    Rehab Potential Fair    PT Frequency 2x / week    PT Duration Other (comment)   7 weeks   PT Treatment/Interventions Therapeutic activities;Therapeutic exercise;Neuromuscular re-education;Patient/family education;Manual techniques;Dry needling;Spinal Manipulations;[place] Manipulations;Aquatic Therapy;Electrical Stimulation;Iontophoresis 56m/ml Dexamethasone;Traction;Ultrasound;Gait training;Stair training;Functional mobility training    PT Next Visit Plan trunk, core, hip strengthening, lumbopelvic control, manual techniques, modalities PRN    PT Home Exercise Plan Medbridge Access Code [email or web address]    Consulted and Agree with Plan of Care Patient           Patient will benefit from skilled therapeutic intervention in order to improve the following deficits and impairments:  Pain,Postural dysfunction,Improper body  mechanics,Difficulty walking,Decreased strength,Abnormal gait,Decreased activity tolerance  Visit Diagnosis: Acute bilateral low back pain, unspecified whether sciatica present  Radiculopathy, lumbar region  Muscle weakness (generalized)  Difficulty in walking, not elsewhere classified     Problem List Patient Active Problem List   Diagnosis Date Noted  . Abnormal Pap smear 08/21/2020  . Female pelvic pain 08/21/2020  . Bacterial vaginosis 04/13/2012  . Oral yeast infection 04/13/2012  . GERD (gastroesophageal reflux disease) 07/15/2011    MJoneen BoersPT, DPT   11/18/2020, 1:44 PM  [facility] [address] [place] [address]Phone: [phone]  Fax:  [phone] Name: MClaytonJoint MRN: [id number] of Birth: [birth year]

## 2020-11-27 ENCOUNTER — Ambulatory Visit: Payer: Medicaid Other

## 2020-12-01 ENCOUNTER — Ambulatory Visit: Payer: Medicaid Other

## 2020-12-01 ENCOUNTER — Other Ambulatory Visit: Payer: Self-pay

## 2020-12-01 DIAGNOSIS — M545 Low back pain, unspecified: Secondary | ICD-10-CM

## 2020-12-01 DIAGNOSIS — M5416 Radiculopathy, lumbar region: Secondary | ICD-10-CM

## 2020-12-01 DIAGNOSIS — M6281 Muscle weakness (generalized): Secondary | ICD-10-CM

## 2020-12-01 DIAGNOSIS — R262 Difficulty in walking, not elsewhere classified: Secondary | ICD-10-CM

## 2020-12-01 NOTE — Therapy (Signed)
[facility] [address] [place], [address] Phone: [phone]   Fax:  [phone]  Physical Therapy Treatment  Patient Details  Name: Brittany Holland MRN: [id number] Date of Birth: 1980-12-22 Referring Provider (PT): Gennette Pac, FNP   Encounter Date: 12/01/2020   PT End of Session - 12/01/20 1526    Visit Number 16    Number of Visits 27    Date for PT Re-Evaluation 12/04/20    Authorization Type 7    Authorization Time Period of 8 Medicaid until    PT Start Time 1526    PT Stop Time 1607    PT Time Calculation (min) 41 min    Activity Tolerance Patient tolerated treatment well    Behavior During Therapy [facility] for tasks assessed/performed           Past Medical History:  Diagnosis Date   Hypertension     Past Surgical History:  Procedure Laterality Date   CESAREAN SECTION     x 2    There were no vitals filed for this visit.   Subjective Assessment - 12/01/20 1529    Subjective Was prescribed another blood pressure medication. Does not remember the name. A little pain in the R LE area, about a 4/10. Back is a 3/10 currently.    Pertinent History Low back pain with radiating symptoms. Pain along R low back, posterior hip radiating to proximal medial leg (along the L 4 dermatome). Sudden onset in June 2021. Pt just started having sharp pain in her back when reaching up with her R UE in the shower. Does a lot of walking at work. Works 8 hours. Does not really do heavy lifting. 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 Has not had back pain before. Pt was told to have sciatic nerve pain on one side of the body. Denies loss of bowel or bladder control. Denies saddle anesthesia.    Patient Stated Goals Be able to get into and out of the care more comfortably.    Currently in Pain? Yes    Pain Score 4    R LE   Pain Onset More than a  month ago                                     PT Education - 12/01/20 1538    Education Details ther-ex    [employer]) Educated Patient    Methods Explanation;Demonstration;Tactile cues;Verbal cues    Comprehension Returned demonstration;Verbalized understanding             Objective   No latex band allergies  MedbridgeAccess Code [email or web address]  Most recent C-section 2007    Therapeuticactivities BP L arm sitting, mechanically taken, normal cuff: 142/92, HR109  Seated L lateral shift isometrics to counter R lateral shift posture 10x3 with 5 second holds   Seated R hip extension isometrics 10x3 with 5 second holds   Standing static lunge   R 10x3  Standing B shoulder extension red band 10x3 with 5 second holds   SLS with B UE Assist   R LE 10x5 seconds   L LE 10x5 seconds      Improved technique, movement at target joints, use of target muscles after min verbal, visual, tactile cues.    Manual therapy Seated STM B lumbar  paraspinal muscles to decrease muscle tension and fascial restrictions     Response to treatment Decreased back pain after session. R LE feels the same.    Clinical impression  Improved blood pressure levels to within appropriate limits for more exercises. Performed trunk, scapular, and glute strengthening to help maintain more neutral posture to low back. Continued with manual therapy to decrease lumbar paraspinal muscle tension. Decreased back pain after session. No change in R LE symptoms today. Pt will benefit from continued skilled physical therapy services to decrease pain, improve strength and function.         PT Short Term Goals - 08/18/20 0908      PT SHORT TERM GOAL #1   Title Patient will be able to perform her HEP consistently to decrease pain, and improve function.    Baseline Pt has started her HEP. (07/24/2020); Has been doing her HEP consistently  (08/18/2020)    Time 3    Period Weeks    Status Achieved    Target Date 08/14/20             PT Long Term Goals - 11/04/20 0853      PT LONG TERM GOAL #1   Title Patient will have a decrease in low back pain to 3/10 or less at worst to promote ability to get into and out of her car as well as perform tasks which involve bending over more comfortably.  R LE is about 7/10 currently.    Baseline 7/10 low back pain at most for the past month (07/24/2020); 6/10 at most for the past 7 days (08/18/2020); 5/10 at most for the past 7 days (09/04/2020); 3-4/10 low back pain at most for the past 7 days (10/01/2020). 6/10 R low back pain at most for the past 7 days (10/16/2020); 4/10 at most for the past 7 days (11/04/2020)    Time 7    Period Weeks    Status Partially Met    Target Date 12/04/20      PT LONG TERM GOAL #2   Title Patient will have a decrease in  RLE pain to 3/10 or less at worst to promote ability to get into and out of her car as well as perform tasks which involve bending over more comfortably.    Baseline 8/10 R LE pain at most for the past month without pain medication (07/24/2020); 7/10 at most for the past 7 days (08/18/2020), 7/10 at most for the past 7 days (09/04/2020); 6-8/10 at most for the past 7 day. 8/10 at most for the past 7 days (10/16/2020); 6/10 at most for the past 7 days (11/04/2020)    Time 7    Period Weeks    Status On-going    Target Date 12/04/20      PT LONG TERM GOAL #3   Title Patient will improve bilateral hip extension and abduction strength by at least 1/2 MMT grade to promote ability to perform standing tasks more comfortably for her back and R LE.    Baseline Hip extension 4-/5 bilaterally, hip abduction 3-/5 R, 4-/5 L (07/24/2020); Hip abduction 4/5 R, 4/5 L, hip extension 4-/5 R,4-/5 L (08/18/2020); Hip abduction: 4+/5 R, 4+/5 L; hip extension 4/5 R, 4/5 L  (09/01/2020)    Time 2    Period Weeks    Status Achieved      PT LONG TERM GOAL #4   Title  Patient will improve her lumbar FOTO by at least 10 points  as a demonstration of improved function.    Baseline Lumbar FOTO 56 (07/24/2020); 56 (08/18/2020);Lumbar FOTO 52  (09/01/2020), (10/01/2020)    Time 7    Period Weeks    Status On-going    Target Date 12/04/20                 Plan - 12/01/20 1528    Clinical Impression Statement Improved blood pressure levels to within appropriate limits for more exercises. Performed trunk, scapular, and glute strengthening to help maintain more neutral posture to low back. Continued with manual therapy to decrease lumbar paraspinal muscle tension. Decreased back pain after session. No change in R LE symptoms today. Pt will benefit from continued skilled physical therapy services to decrease pain, improve strength and function.    Personal Factors and Comorbidities Fitness    Examination-Activity Limitations Bed Mobility;Bathing;Hygiene/Grooming;Squat;Lift;Bend;Stand;Reach Overhead;Carry;Transfers    Stability/Clinical Decision Making Stable/Uncomplicated    Rehab Potential Fair    PT Frequency 2x / week    PT Duration Other (comment)   7 weeks   PT Treatment/Interventions Therapeutic activities;Therapeutic exercise;Neuromuscular re-education;Patient/family education;Manual techniques;Dry needling;Spinal Manipulations;Todaro Manipulations;Aquatic Therapy;Electrical Stimulation;Iontophoresis 33m/ml Dexamethasone;Traction;Ultrasound;Gait training;Stair training;Functional mobility training    PT Next Visit Plan trunk, core, hip strengthening, lumbopelvic control, manual techniques, modalities PRN    PT Home Exercise Plan Medbridge Access Code [email or web address]    Consulted and Agree with Plan of Care Patient           Patient will benefit from skilled therapeutic intervention in order to improve the following deficits and impairments:  Pain,Postural dysfunction,Improper body mechanics,Difficulty walking,Decreased strength,Abnormal gait,Decreased activity  tolerance  Visit Diagnosis: Acute bilateral low back pain, unspecified whether sciatica present  Radiculopathy, lumbar region  Muscle weakness (generalized)  Difficulty in walking, not elsewhere classified     Problem List Patient Active Problem List   Diagnosis Date Noted   Abnormal Pap smear 08/21/2020   Female pelvic pain 08/21/2020   Bacterial vaginosis 04/13/2012   Oral yeast infection 04/13/2012   GERD (gastroesophageal reflux disease) 07/15/2011    Brittany BoersPT, DPT   12/01/2020, 4:21 PM  [facility] [address] [place] [address]Phone: [phone]  Fax:  [phone] Name: MKittieJoint MRN: [id number] of Birth: 3Aug 06, 1982

## 2020-12-04 ENCOUNTER — Ambulatory Visit: Payer: Medicaid Other

## 2020-12-04 ENCOUNTER — Telehealth: Payer: Self-pay

## 2020-12-04 NOTE — Telephone Encounter (Signed)
No show. Called patient and left a message pertaining to today's session. Return phone call requested. Phone number [phone]) provided.

## 2020-12-08 ENCOUNTER — Ambulatory Visit: Payer: Medicaid Other

## 2020-12-09 ENCOUNTER — Ambulatory Visit: Payer: Medicaid Other | Admitting: Podiatry

## 2020-12-10 ENCOUNTER — Ambulatory Visit: Payer: Medicaid Other | Attending: Family Medicine

## 2021-01-01 ENCOUNTER — Other Ambulatory Visit: Payer: Self-pay

## 2021-01-01 ENCOUNTER — Ambulatory Visit (INDEPENDENT_AMBULATORY_CARE_PROVIDER_SITE_OTHER): Payer: Medicaid Other | Admitting: Podiatry

## 2021-01-01 ENCOUNTER — Encounter: Payer: Self-pay | Admitting: Podiatry

## 2021-01-01 DIAGNOSIS — B353 Tinea pedis: Secondary | ICD-10-CM

## 2021-01-01 MED ORDER — CLOTRIMAZOLE-BETAMETHASONE 1-0.05 % EX CREA: 1.0000 "application " | TOPICAL_CREAM | Freq: Two times a day (BID) | CUTANEOUS | 1 refills | Status: AC

## 2021-01-01 MED ORDER — ITRACONAZOLE 100 MG PO CAPS: ORAL_CAPSULE | ORAL | 0 refills | Status: DC

## 2021-01-01 NOTE — Progress Notes (Signed)
Subjective:  Patient ID: Brittany Holland, female    DOB: 11-01-1981,  MRN: [id number]  Chief Complaint  Patient presents with  . Tinea Pedis    "its better but not gone completely away"    40 y.o. female presents with the above complaint.  Patient presents with follow-up of bilateral athlete's foot left greater than right.  She states she is doing a lot better.  She has been taking itraconazole cream as well as Lotrisone cream and has helped considerably.  She denies any other acute complaints she would like to do a refill as it has not completely gone away.   Review of Systems: Negative except as noted in the HPI. Denies N/V/F/Ch.  Past Medical History:  Diagnosis Date  . Hypertension     Current Outpatient Medications:  .  butalbital-acetaminophen-caffeine (FIORICET) 50-325-40 MG tablet, Take 1-2 tablets by mouth every 6 (six) hours as needed for headache., Disp: 20 tablet, Rfl: 0 .  clotrimazole-betamethasone (LOTRISONE) cream, Apply 1 application topically 2 (two) times daily., Disp: 30 g, Rfl: 0 .  clotrimazole-betamethasone (LOTRISONE) cream, Apply 1 application topically 2 (two) times daily., Disp: 45 g, Rfl: 1 .  cyclobenzaprine (FLEXERIL) 5 MG tablet, Take 1 tablet (5 mg total) by mouth 3 (three) times daily as needed., Disp: 15 tablet, Rfl: 0 .  ibuprofen (ADVIL) 800 MG tablet, Take 800 mg by mouth 3 (three) times daily., Disp: , Rfl:  .  itraconazole (SPORANOX) 100 MG capsule, Take 1 tablet twice daily x 7 days, then hold for 3 weeks, then repeat cycle, Disp: 14 capsule, Rfl: 0 .  itraconazole (SPORANOX) 100 MG capsule, Take 1 tablet twice daily x 7 days, then hold for 3 weeks, then repeat cycle, Disp: 30 capsule, Rfl: 0 .  losartan (COZAAR) 25 MG tablet, Take 25 mg by mouth daily., Disp: , Rfl:  .  Multiple Vitamin (MULTI-VITAMIN) tablet, Take 1 tablet by mouth daily., Disp: , Rfl:  .  terbinafine (LAMISIL) 250 MG tablet, Take 1 tablet (250 mg total) by mouth daily., Disp: 30  tablet, Rfl: 0  Social History   Tobacco Use  Smoking Status Never Smoker  Smokeless Tobacco Never Used    No Known Allergies Objective:  There were no vitals filed for this visit. There is no height or weight on file to calculate BMI. Constitutional Well developed. Well nourished.  Vascular Dorsalis pedis pulses palpable bilaterally. Posterior tibial pulses palpable bilaterally. Capillary refill normal to all digits.  No cyanosis or clubbing noted. Pedal hair growth normal.  Neurologic Normal speech. Oriented to person, place, and time. Epicritic sensation to light touch grossly present bilaterally.  Dermatologic  multiple patches of scaliness with a superficial epidermal lysis with subjective itching noted to bilateral lower extremity left severe than right.  90% improved  No pain on palpation of the entire/total nail on 1st digit of the left  Orthopedic: Normal Brinson ROM without pain or crepitus bilaterally. No visible deformities. No bony tenderness.   Radiographs: None Assessment:   1. Tinea pedis of both feet    Plan:  Patient was evaluated and treated and all questions answered.  Bilateral athlete's foot left greater than right -Clinically it is greater than 90% improved.  I encouraged her that she can do another round of itraconazole cream and Lotrisone cream to completely eradicate the athlete's foot.  Patient agrees with the plan would like to proceed with the refill. -If no other foot and ankle issues arise in the future I will see  her back as needed.  She states understanding  Nail contusion/dystrophy hallux, left -Clinically healed.  No pain noted.  No follow-ups on file.   No follow-ups on file.

## 2021-02-06 ENCOUNTER — Telehealth: Payer: Self-pay

## 2021-02-06 NOTE — Telephone Encounter (Signed)
Patient came into office today and requested another refill of the Itraconazole tablets.  She is requesting 60 pills this time, enough for 1 month.  She stated that she was taking 1 tab twice daily for 30 days and the instructions on the script are different.  Please verify and let me know If I can send in another refill.  Thanks

## 2021-02-09 NOTE — Telephone Encounter (Signed)
Do you want her to take 1 twice daily for 30 days or 1 twice day x 7 days then hold for 3 weeks?  Please verify

## 2021-02-09 NOTE — Telephone Encounter (Signed)
Just do 30 days worth not 60.

## 2021-02-10 MED ORDER — ITRACONAZOLE 100 MG PO CAPS: ORAL_CAPSULE | ORAL | 1 refills | Status: AC

## 2021-02-10 NOTE — Telephone Encounter (Signed)
We can do 1 week twice a day for 7 days and then hold for 3 weeks.

## 2021-02-10 NOTE — Addendum Note (Signed)
Addended by: Geraldine Contras D on: 02/10/2021 08:53 AM   Modules accepted: Orders

## 2021-02-10 NOTE — Telephone Encounter (Signed)
I called patient and explained the directions for taking Sporanox.  She verbalized understanding.  New script has been sent to pharmacy

## 2021-09-25 IMAGING — CR DG LUMBAR SPINE 2-3V
1 series · 3 of 3 positions shown · non-contrast
Comparison: None.

CLINICAL DATA: Acute low back pain and right lower extremity pain.

EXAM:
LUMBAR SPINE - 2-3 VIEW

[Series 1: dg lumbar spine 2-3 views · 0.14mm/px · 3 of 3 slices shown]
[im 1/3]
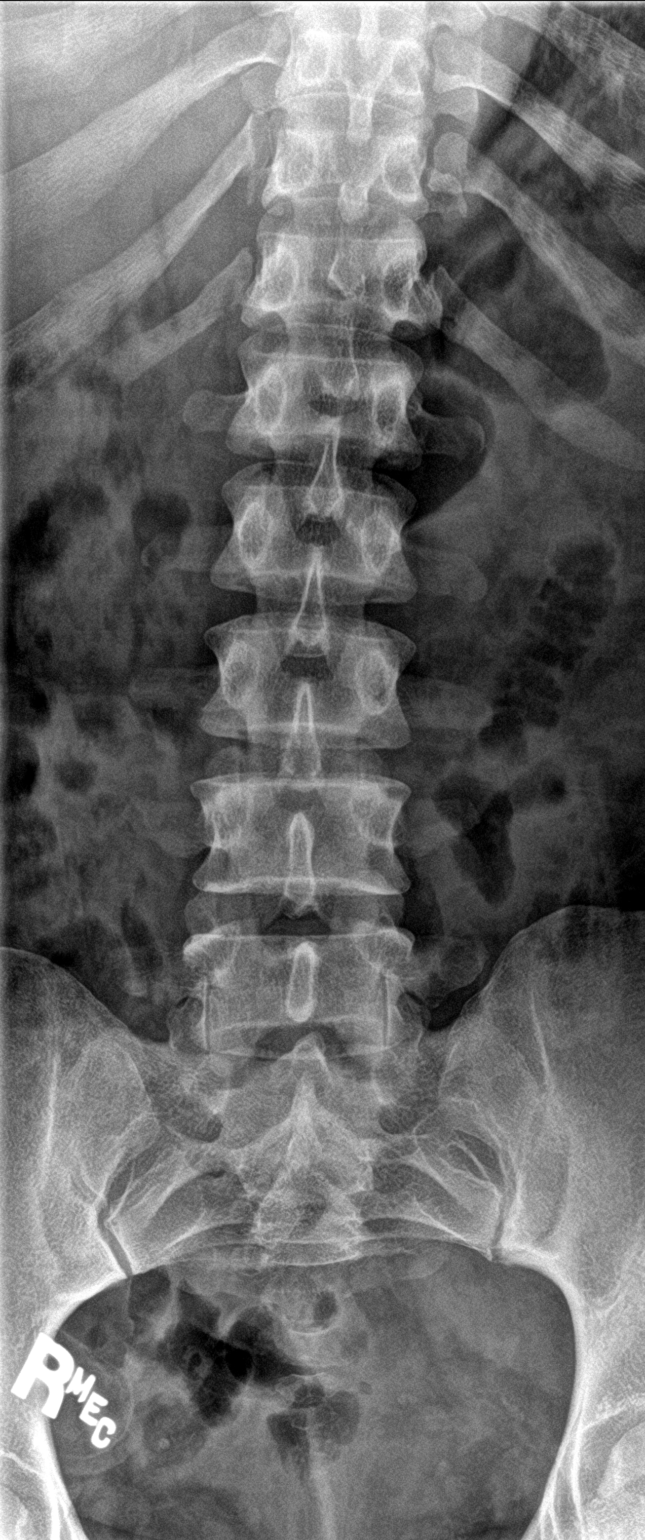
[im 2/3]
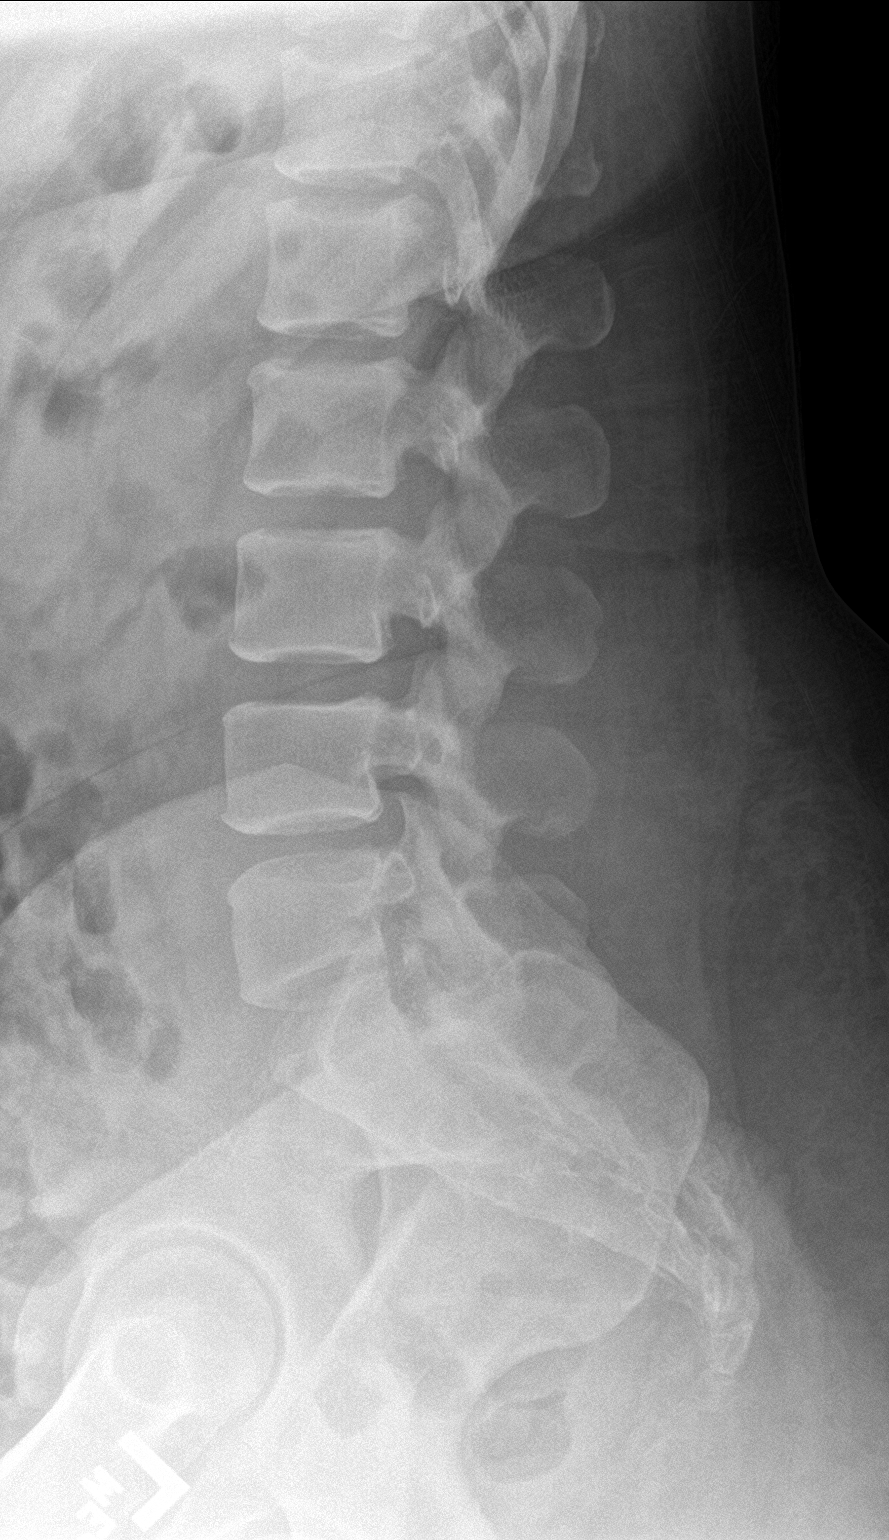
[im 3/3]
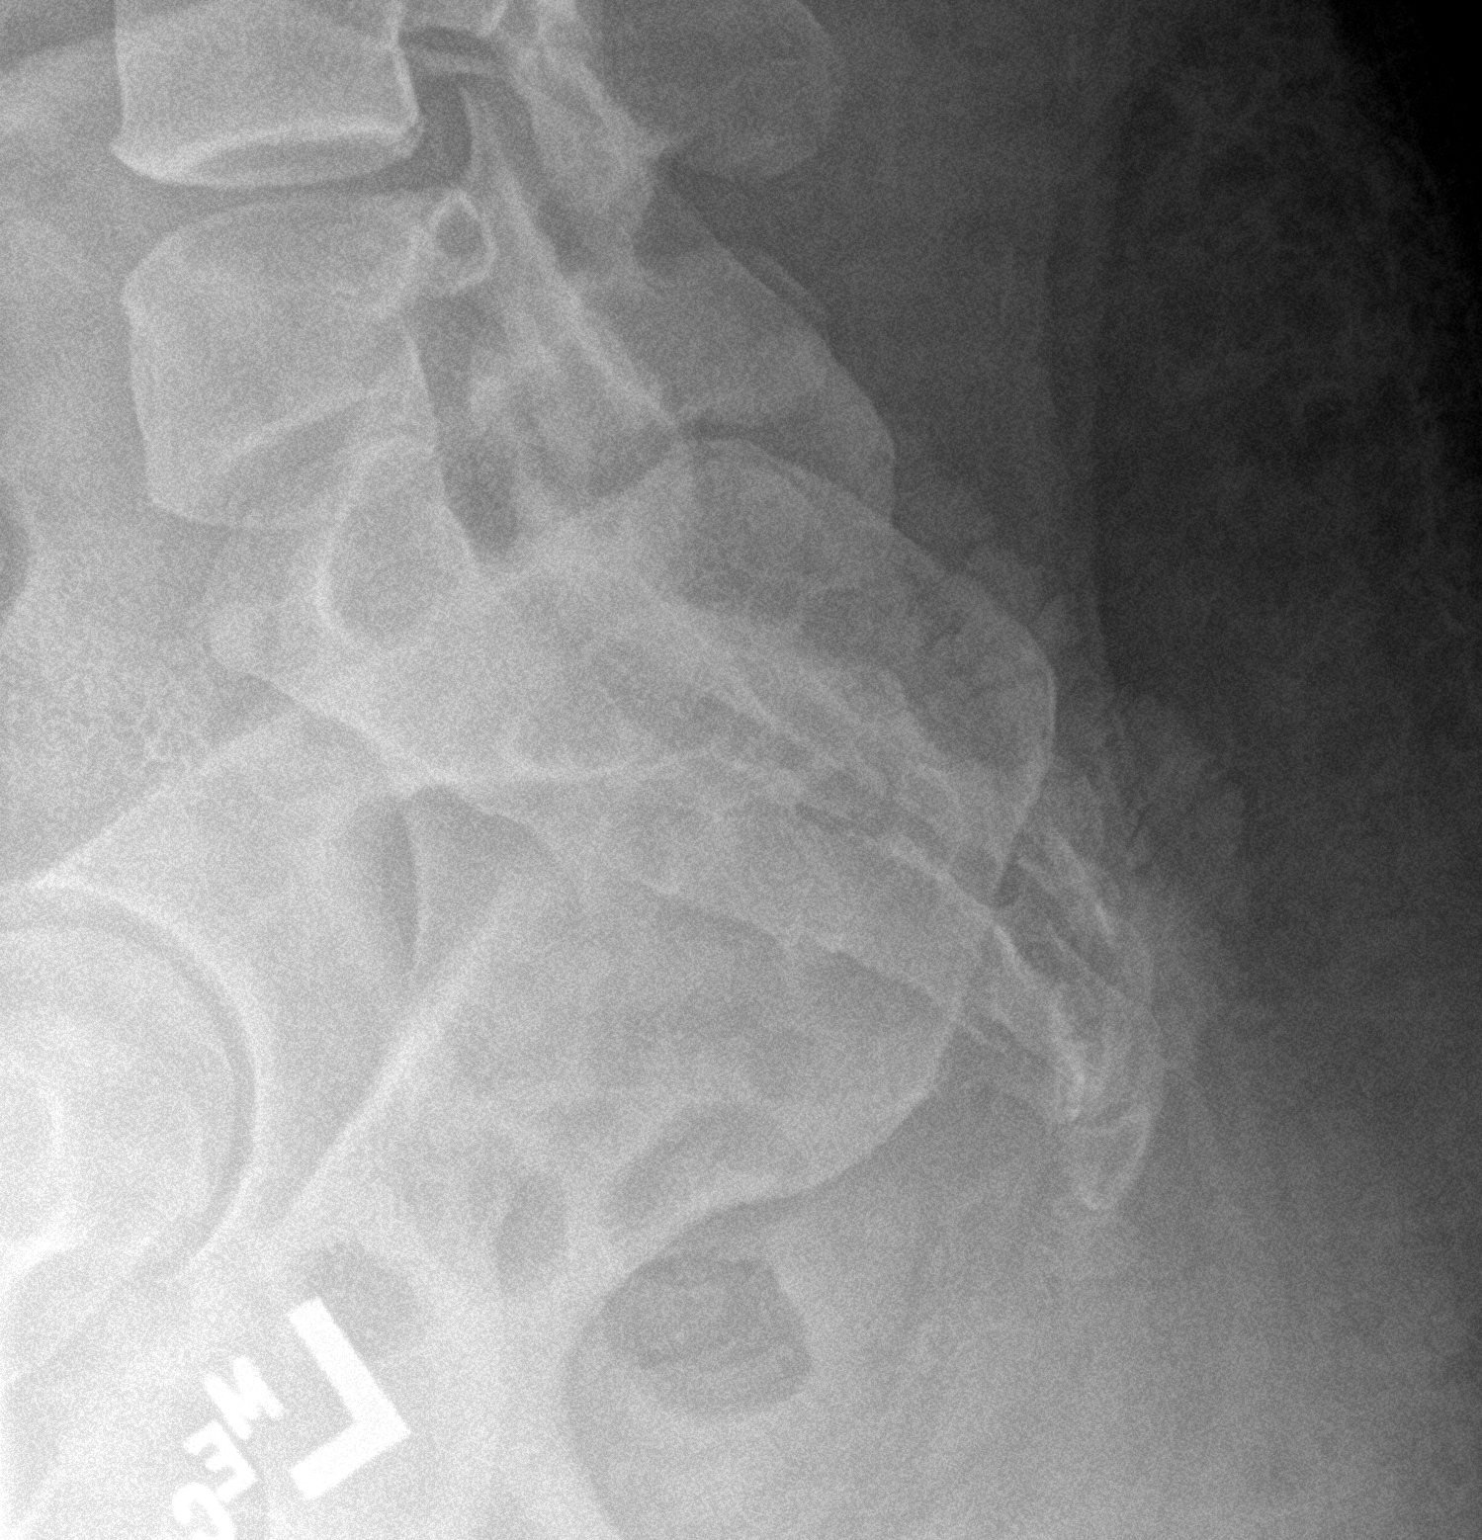

[3 of 3 positions shown; findings below may reference images not displayed]

FINDINGS: There is no evidence of lumbar spine fracture. Alignment is normal.
Intervertebral disc spaces are maintained. No facet arthritis. Five
typical lumbar segments.
IMPRESSION: Normal lumbar spine.
